# Patient Record
Sex: Female | Born: 1970 | Race: White | Hispanic: No | Marital: Married | Smoking: Never smoker
Health system: Southern US, Community
[De-identification: ages and names within clinical notes are randomized; demographics above are authoritative.]

## PROBLEM LIST (undated history)

## (undated) DIAGNOSIS — F419 Anxiety disorder, unspecified: Secondary | ICD-10-CM

## (undated) DIAGNOSIS — N200 Calculus of kidney: Secondary | ICD-10-CM

## (undated) DIAGNOSIS — F39 Unspecified mood [affective] disorder: Secondary | ICD-10-CM

## (undated) DIAGNOSIS — E785 Hyperlipidemia, unspecified: Secondary | ICD-10-CM

## (undated) DIAGNOSIS — M542 Cervicalgia: Secondary | ICD-10-CM

## (undated) HISTORY — DX: Calculus of kidney: N20.0

## (undated) HISTORY — PX: ABLATION: SHX5711

## (undated) HISTORY — DX: Cervicalgia: M54.2

## (undated) HISTORY — DX: Unspecified mood (affective) disorder: F39

## (undated) HISTORY — DX: Hyperlipidemia, unspecified: E78.5

## (undated) HISTORY — PX: ENDOMETRIAL ABLATION: SHX621

---

## 2000-02-13 ENCOUNTER — Other Ambulatory Visit: Admission: RE | Admit: 2000-02-13 | Discharge: 2000-02-13 | Payer: Self-pay | Admitting: *Deleted

## 2001-03-02 ENCOUNTER — Other Ambulatory Visit: Admission: RE | Admit: 2001-03-02 | Discharge: 2001-03-02 | Payer: Self-pay | Admitting: Obstetrics and Gynecology

## 2001-09-27 ENCOUNTER — Inpatient Hospital Stay (HOSPITAL_COMMUNITY): Admission: AD | Admit: 2001-09-27 | Discharge: 2001-09-30 | Payer: Self-pay | Admitting: Obstetrics and Gynecology

## 2001-11-03 ENCOUNTER — Other Ambulatory Visit: Admission: RE | Admit: 2001-11-03 | Discharge: 2001-11-03 | Payer: Self-pay | Admitting: Obstetrics and Gynecology

## 2002-11-16 ENCOUNTER — Other Ambulatory Visit: Admission: RE | Admit: 2002-11-16 | Discharge: 2002-11-16 | Payer: Self-pay | Admitting: Obstetrics and Gynecology

## 2005-09-16 ENCOUNTER — Ambulatory Visit (HOSPITAL_COMMUNITY): Admission: RE | Admit: 2005-09-16 | Discharge: 2005-09-16 | Payer: Self-pay | Admitting: Obstetrics and Gynecology

## 2005-09-16 ENCOUNTER — Encounter (INDEPENDENT_AMBULATORY_CARE_PROVIDER_SITE_OTHER): Payer: Self-pay | Admitting: *Deleted

## 2007-11-13 ENCOUNTER — Emergency Department (HOSPITAL_BASED_OUTPATIENT_CLINIC_OR_DEPARTMENT_OTHER): Admission: EM | Admit: 2007-11-13 | Discharge: 2007-11-13 | Payer: Self-pay | Admitting: Emergency Medicine

## 2009-06-30 ENCOUNTER — Ambulatory Visit (HOSPITAL_COMMUNITY): Admission: RE | Admit: 2009-06-30 | Discharge: 2009-06-30 | Payer: Self-pay | Admitting: Internal Medicine

## 2010-05-25 NOTE — Discharge Summary (Signed)
   NAME:  Carmen Robinson, Carmen Robinson                        ACCOUNT NO.:  [id number]   MEDICAL RECORD NO.:  [id number]                   PATIENT TYPE:  INP   LOCATION:  9104                                 FACILITY:  [facility]   PHYSICIAN:  Lenoard Aden, M.D.             DATE OF BIRTH:  1970/02/01   DATE OF ADMISSION:  09/27/2001  DATE OF DISCHARGE:  09/30/2001                                 DISCHARGE SUMMARY   HISTORY OF PRESENT ILLNESS:  The patient underwent uncomplicated primary  cesarean section for nonreassuring fetal heart rate and active phase arrest  on September 27, 2001.  Postoperative course uncomplicated.  Tolerated  regular diet without difficulty.  Hemoglobin of 9.5.  Discharged to home day  three.  Prenatal vitamins, iron, Prozac, and Tylox are discharge  medications.  Discharge teaching done.  Follow up in the office four to six  weeks.                                               Lenoard Aden, M.D.    RJT/MEDQ  D:  11/01/2001  T:  11/02/2001  Job:  [id number]

## 2010-05-25 NOTE — Op Note (Signed)
NAME:  Carmen Robinson, Carmen Robinson              ACCOUNT NO.:  [id number]   MEDICAL RECORD NO.:  [id number]          PATIENT TYPE:  AMB   LOCATION:  SDC                           FACILITY:  [facility]   PHYSICIAN:  Lenoard Aden, M.D.DATE OF BIRTH:  [birth year]   DATE OF PROCEDURE:  09/16/2005  DATE OF DISCHARGE:                                 OPERATIVE REPORT   PREOPERATIVE DIAGNOSES:  1. Dysmenorrhea.  2. Menorrhagia.   POSTOPERATIVE DIAGNOSES:  1. Dysmenorrhea.  2. Menorrhagia.   PROCEDURE:  Diagnostic hysteroscopy, D&C and NovaSure endometrial ablation.   SURGEON:  Lenoard Aden, M.D.   ANESTHESIA:  General.   ESTIMATED BLOOD LOSS:  Less than 50 mL.   COMPLICATIONS:  None.   FLUID DEFICIT:  10 mL.   Patient to recovery in good condition.   DESCRIPTION OF PROCEDURE:  After being apprised of the risks of anesthesia,  infection, bleeding, uterine perforation and possible need for repair,  delayed risks, immediate complications to include bowel and bladder injury  with possible need for repair, increased vaginal discharge for approximately  2-3 weeks. The patient was brought to the operating room where she was  administered a general anesthetic without complications, prepped and draped  in the usual sterile fashion, catheterized until the bladder was empty. Exam  under anesthesia reveals a mid position uterus, bulky, no adnexal masses.  The uterus sounds to 10 cm, intracervical length of 5 cm, cervix is easily  dilated up from a 23 Pratt dilator, hysteroscope placed, thickened  endometrial, bilateral normal tubal ostia, no evidence of structural lesions  noted. D&C performed using sharp curettings without difficulty. Please note  prior to the procedure, a dilute Pitressin solution was placed at 3 and 9  o'clock at the cervicovaginal junction, 16 mL of a dilute solution noted. At  this time, the hysteroscope was replaced and __________  D&C revealing no  evidence of any focal  structural lesions. Ablation is performed using the  standard sequence. CO2 test is negative. Cavity width is measured up to 4.2  cm. The procedure is initiated and terminated in 90 seconds. Instruments  removed  atraumatically. Revisualization reveals a normal endometrial cavity, no  evidence of perforation, good ablation of the endometrial cavity is  appreciated. At this time, all instruments are removed from the vagina. The  patient tolerated the procedure well, was awakened and transferred to  recovery in good condition.      Lenoard Aden, M.D.  Electronically Signed     RJT/MEDQ  D:  09/16/2005  T:  09/17/2005  Job:  [id number]

## 2010-05-25 NOTE — Op Note (Signed)
NAME:  Carmen Robinson, Carmen Robinson                        ACCOUNT NO.:  [id number]   MEDICAL RECORD NO.:  [id number]                   PATIENT TYPE:  INP   LOCATION:  9167                                 [facility]ACILITY:  [facility]   PHYSICIAN:  Lenoard Aden, M.D.             DATE OF BIRTH:  Nov 04, 1970   DATE OF PROCEDURE:  09/27/2001  DATE OF DISCHARGE:                                 OPERATIVE REPORT   PREOPERATIVE DIAGNOSES:  1. Intrauterine pregnancy at 38 weeks.  2. Active phase arrest.  3. Nonreassuring fetal heart rate.  4. Presumed large for gestational age.   POSTOPERATIVE DIAGNOSES:  1. Intrauterine pregnancy at 38 weeks.  2. Active phase arrest.  3. Nonreassuring fetal heart rate.  4. Presumed large for gestational age.   PROCEDURE:  Primary low transverse cesarean section.   SURGEON:  Lenoard Aden, M.D.   ASSISTANT:  Pershing Cox, M.D.   ANESTHESIA:  Epidural by Belva Agee, M.D.   ESTIMATED BLOOD LOSS:  1000 cc.   COMPLICATIONS:  None.   DRAINS:  Foley.   COUNTS:  Correct.   FINDINGS:  Full-term living female, right occipitotransverse position, Apgars  9 and 9.  Umbilical artery pH 7.32. Normal tubes and ovaries.  Patient to  recovery in good condition.   DESCRIPTION OF PROCEDURE:  After being apprised of the risks of anesthesia,  infection, bleeding, injury to abdominal organs and need for repair, the  patient was brought to the operating room where she was administered a  dosing of epidural anesthetic without complications, prepped and draped in  the usual sterile fashion and Foley catheter previously placed.  After  achieving adequate anesthesia, dilute Marcaine solution was placed in the  area of the Pfannenstiel skin incision which was then made with the scalpel,  carried down to the fascia which was nicked in the midline and opened  transversely using Mayo scissors.  The rectus muscles were dissected sharply  in the midline, peritoneum dissected  bluntly, and cavity entered without  complications.  Bladder blade placed.  The visceral peritoneum was scored in  a smile-like fashion, dissected sharply off the lower uterine segment.  The  uterus was scored in a smiling fashion.  Atraumatic delivery of full-term  living female from right occiput transverse position, handed to the  pediatricians in attendance, showing Apgars of 9 and 9.  Umbilical artery  blood is collected; cord pH is 7.32.  The placenta is delivered manually  intact after collection of cord blood.  Uterus was delivered into the  abdominal cavity and noted to be normal.  Normal cavity, normal tubes,  normal tubes.  Uterus is curetted using a dry lap pack and closed using a 0  Monocryl in a continuous running fashion.  A second imbricating layer is  placed with multiple interrupted sutures placed to control small bleeding  points along the midline and left lateral aspect of the  incision.  The  bladder flap was inspected and found to be hemostatic.  The pericolic  gutters were irrigated.  All blood clots were subsequently removed.  After  achieving adequate hemostasis, the fascia was closed using a 1 Vicryl in a  continuous running fashion.  The skin was closed with staples.  The patient  tolerated the procedure well and was transferred to recovery in good  condition.                                               Lenoard Aden, M.D.    RJT/MEDQ  D:  09/27/2001  T:  09/28/2001  Job:  [id number]

## 2010-05-25 NOTE — H&P (Signed)
   NAME:  Carmen Robinson, Carmen Robinson                        ACCOUNT NO.:  [id number]   MEDICAL RECORD NO.:  [id number]                   PATIENT TYPE:  INP   LOCATION:  9167                                 [facility]ACILITY:  [facility]   PHYSICIAN:  Lenoard Aden, M.D.             DATE OF BIRTH:  [birth year]   DATE OF ADMISSION:  09/27/2001  DATE OF DISCHARGE:                                HISTORY & PHYSICAL   CHIEF COMPLAINT:  Large for gestational age for induction.   HISTORY OF PRESENT ILLNESS:  Patient is a 40 year old white female G2, P1  with EDD of October 09, 2001 at 38+ weeks' with presumed macrosomia and  borderline polyhydramnios for induction.   ALLERGIES:  Allergic to SHELLFISH and PENICILLIN.   HISTORY:  1. Medications:  Prenatal vitamins.  2. History of spontaneous vaginal delivery in 1999.  3. 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  4. Previous history of depression on Prozac.  No other medical, surgical or hospitalizations.   FAMILY HISTORY:  Heart disease, smoking and hypertension.   PRENATAL LABORATORY DATA:  Blood type of O+, negative antibody screen, RPR  nonreactive, rubella immune, hepatitis B surface antigen negative, HIV  negative, and Group B Strep is positive.   PHYSICAL EXAMINATION:  GENERAL:  She is a well-developed, well-nourished  somewhat anxious white female.  No apparent distress.  HEENT:  Normal.  LUNGS:  Clear.  HEART:  Regular rate and rhythm.  ABDOMEN:  Abdomen is soft, gravid, nontender.  Estimated fetal weight is 8.0  to 8.5 pounds.  Cervix is 2.0 cm, 50% vertex, -2.  EXTREMITIES:  No cords.  NEUROLOGICAL:  Nonfocal.   IMPRESSION:  1. 38 weeks' intrauterine pregnancy.  2. Presumed large for gestational age with borderline polyhydramnios.    PLAN:  Proceed with induction.  Risks, benefits of induction versus  expectant management discussed.  Patient acknowledges and wishes to proceed  with induction.                    Lenoard Aden, M.D.    RJT/MEDQ  D:  09/27/2001  T:  09/27/2001  Job:  [id number]   cc:   [facility]

## 2010-05-27 ENCOUNTER — Inpatient Hospital Stay (HOSPITAL_COMMUNITY)
Admission: AD | Admit: 2010-05-27 | Discharge: 2010-05-27 | Disposition: A | Discharge status: Home / Self Care | Payer: BC Managed Care – PPO | Source: Ambulatory Visit | Attending: Obstetrics and Gynecology | Admitting: Obstetrics and Gynecology

## 2010-05-27 DIAGNOSIS — R1032 Left lower quadrant pain: Secondary | ICD-10-CM | POA: Insufficient documentation

## 2010-05-27 LAB — URINALYSIS, ROUTINE W REFLEX MICROSCOPIC
Bilirubin Urine: NEGATIVE
Leukocytes, UA: NEGATIVE
Nitrite: NEGATIVE
Protein, ur: NEGATIVE mg/dL

## 2010-05-27 LAB — URINE MICROSCOPIC-ADD ON

## 2010-05-27 LAB — POCT PREGNANCY, URINE: Preg Test, Ur: NEGATIVE

## 2010-05-29 ENCOUNTER — Other Ambulatory Visit: Payer: Self-pay | Admitting: Obstetrics and Gynecology

## 2010-05-29 DIAGNOSIS — R1031 Right lower quadrant pain: Secondary | ICD-10-CM

## 2010-05-30 ENCOUNTER — Ambulatory Visit
Admission: RE | Admit: 2010-05-30 | Discharge: 2010-05-30 | Disposition: A | Discharge status: Home / Self Care | Payer: BC Managed Care – PPO | Source: Ambulatory Visit | Attending: Obstetrics and Gynecology | Admitting: Obstetrics and Gynecology

## 2010-05-30 DIAGNOSIS — R1031 Right lower quadrant pain: Secondary | ICD-10-CM

## 2010-05-30 MED ORDER — IOHEXOL 300 MG/ML  SOLN
100.0000 mL | Freq: Once | INTRAMUSCULAR | Status: AC | PRN
  Administered 2010-05-30: 100 mL via INTRAVENOUS

## 2010-05-31 ENCOUNTER — Telehealth: Payer: Self-pay | Admitting: Internal Medicine

## 2010-05-31 NOTE — Telephone Encounter (Signed)
Sue Lush called and left me a message to call her. Called and left her a message that I scheduled patient to see Willette Cluster, NP on 06/01/10 at 10:30 AM. Requested she send [place] recent office notes.

## 2010-05-31 NOTE — Telephone Encounter (Signed)
Left a message for Carmen Robinson to call me.

## 2010-06-01 ENCOUNTER — Ambulatory Visit: Payer: BC Managed Care – PPO | Admitting: Nurse Practitioner

## 2010-06-01 NOTE — Telephone Encounter (Signed)
Patient called and cancelled appointment for today. States she is going to a urology appointment today. Called and left Sue Lush with [facility] a message that patient cancelled.

## 2010-06-08 NOTE — H&P (Signed)
  NAME:  Carmen Robinson, DUGGIN              ACCOUNT NO.:  [id number]  MEDICAL RECORD NO.:  [id number]           PATIENT TYPE:  O  LOCATION:  [facility]                         [facility]AC[facility]  PHYSICIAN:  Lenoard Aden, M.D.DATE OF BIRTH:  February 07, 1970  DATE OF ADMISSION:  05/27/2010 DATE OF DISCHARGE:  05/27/2010                             HISTORY & PHYSICAL   CHIEF COMPLAINT:  History of intermittent left lower quadrant pain.  She is a 40 year old white female G2, P2, status post NovaSure in 2007, who presents now with a less than 1 week history of vague left lower quadrant pain for which she had a negative workup, was given Vicodin with now marked improvement in her pain.  She has allergies to Tylenol and medications are multivitamin and Effexor.  She has medical history remarkable for postpartum depression, anemia, menorrhagia, and dysmenorrhea, now resolved.  She has a family history of panic disorder and diabetes.  SURGICAL HISTORY:  Remarkable for C-section and NovaSure as noted.  SOCIAL HISTORY:  She is a nonsmoker and nondrinker.  She denies domestic violence.  PHYSICAL EXAMINATION:  GENERAL:  She is a well-developed, well- nourished, white female in no acute distress. HEENT:  Normal. LUNGS:  Clear. HEART:  Regular rate and rhythm. ABDOMEN:  Soft, nontender. PELVIC:  Normal size uterus and no adnexal masses. EXTREMITIES:  No cords. NEUROLOGIC:  Nonfocal. SKIN:  Intact.  IMPRESSION:  Left lower quadrant pain with negative workup.  Now, pain resolved.  PLAN:  Discharge home.  Follow up in the office for ultrasound pending pain resolution.    Lenoard Aden, M.D.    RJT/MEDQ  D:  05/27/2010  T:  05/28/2010  Job:  [id number]  Electronically Signed by Olivia Mackie M.D. on 06/08/2010 02:00:54 PM

## 2011-10-06 ENCOUNTER — Emergency Department
Admission: EM | Admit: 2011-10-06 | Discharge: 2011-10-06 | Disposition: A | Discharge status: Home / Self Care | Payer: BC Managed Care – PPO | Source: Home / Self Care | Attending: Family Medicine | Admitting: Family Medicine

## 2011-10-06 DIAGNOSIS — IMO0001 Reserved for inherently not codable concepts without codable children: Secondary | ICD-10-CM

## 2011-10-06 DIAGNOSIS — F419 Anxiety disorder, unspecified: Secondary | ICD-10-CM | POA: Insufficient documentation

## 2011-10-06 DIAGNOSIS — L02519 Cutaneous abscess of unspecified hand: Secondary | ICD-10-CM

## 2011-10-06 HISTORY — DX: Anxiety disorder, unspecified: F41.9

## 2011-10-06 MED ORDER — DOXYCYCLINE HYCLATE 100 MG PO TABS: 100.0000 mg | ORAL_TABLET | Freq: Two times a day (BID) | ORAL | Status: DC

## 2011-10-06 MED ORDER — FLUCONAZOLE 150 MG PO TABS: 150.0000 mg | ORAL_TABLET | Freq: Once | ORAL | Status: DC

## 2011-10-06 NOTE — ED Provider Notes (Signed)
History     CSN: [id number]  Arrival date & time 10/06/11  1556   None     Chief Complaint  Patient presents with  . Wound Infection    left hand 3 rd digit     HPI Comments: Patient states that she had an infected left third finger about a month ago that resolved after drainage.  She has now developed recurrent mild pain in the same location. She also complains of intermittent vaginal itching and discharge without abdominal or pelvic pain.  She declines pelvic exam.  Patient is a 41 y.o. female presenting with hand pain. The history is provided by the patient.  Hand Pain This is a recurrent problem. The current episode started yesterday. The problem occurs constantly. The problem has been gradually worsening. Associated symptoms comments: none. Nothing aggravates the symptoms. Nothing relieves the symptoms. She has tried nothing for the symptoms.    Past medical history:  anxiety  Past surgical history:  Uterine ablation  Family history:  Diabetes, heart disease  History  Substance Use Topics  . Smoking status: No  . Smokeless tobacco: Not on file  . Alcohol Use: No     OB History    No data available      Review of Systems  All other systems reviewed and are negative.    Allergies  Penicillins and Shellfish allergy  Home Medications   Current Outpatient Rx  Name Route Sig Dispense Refill  . VENLAFAXINE HCL 75 MG PO TABS Oral Take 75 mg by mouth 2 (two) times daily.    Marland Kitchen DOXYCYCLINE HYCLATE 100 MG PO TABS Oral Take 1 tablet (100 mg total) by mouth 2 (two) times daily. 20 tablet 0  . FLUCONAZOLE 150 MG PO TABS Oral Take 1 tablet (150 mg total) by mouth once. Repeat in two days 2 tablet 0    BP 119/79  Pulse 74  Temp 98.1 F (36.7 C) (Oral)  Resp 16  Ht 5\' 2"  (1.575 m)  Wt 182 lb (82.555 kg)  BMI 33.29 kg/m2  SpO2 97%  Physical Exam Nursing notes and Vital Signs reviewed. Appearance:  Patient appears stated age, and in no acute distress.  Patient is  obese (BMI 33.3) Eyes:  Pupils are equal, round, and reactive to light and accomodation.  Extraocular movement is intact.  Conjunctivae are not inflamed   Abdomen:  Nontender Extremities:  Left third fingertip:  Tender and slightly erythematous but not swollen or fluctuant.  Distal Neurovascular function is intact.  Skin:  No rash present.   ED Course  Procedures none      1. Cellulitis of third finger, left       MDM  Begin doxycycline. Soak fingertip in warm water two or three times daily Patient declines pelvic exam; begin Diflucan; follow-up with GYN if vaginal discharge not resolved one week.        Lattie Haw, MD 10/09/11 [phone]

## 2011-10-06 NOTE — ED Notes (Signed)
Carmen Robinson complains of throbbing pain in her 3 rd finger left hand x 1 day. About a month ago she had redness and swelling at the tip of her finger and a nurse drained the finger. The swelling has returned.   She also complains of discharge, itching and burning in her vagina off and on for a month. She states she has been in the pool a lot more than normal.

## 2011-10-10 ENCOUNTER — Telehealth: Payer: Self-pay | Admitting: *Deleted

## 2012-02-17 ENCOUNTER — Other Ambulatory Visit (HOSPITAL_COMMUNITY): Payer: Self-pay | Admitting: Obstetrics and Gynecology

## 2012-02-17 DIAGNOSIS — Z1231 Encounter for screening mammogram for malignant neoplasm of breast: Secondary | ICD-10-CM

## 2012-02-28 ENCOUNTER — Ambulatory Visit (HOSPITAL_COMMUNITY): Payer: BC Managed Care – PPO

## 2012-03-13 ENCOUNTER — Ambulatory Visit (HOSPITAL_COMMUNITY): Payer: BC Managed Care – PPO

## 2012-05-11 ENCOUNTER — Ambulatory Visit: Payer: BC Managed Care – PPO | Admitting: Family Medicine

## 2012-05-11 DIAGNOSIS — Z0289 Encounter for other administrative examinations: Secondary | ICD-10-CM

## 2014-02-18 ENCOUNTER — Ambulatory Visit (INDEPENDENT_AMBULATORY_CARE_PROVIDER_SITE_OTHER): Payer: BLUE CROSS/BLUE SHIELD | Admitting: Nurse Practitioner

## 2014-02-18 ENCOUNTER — Encounter: Payer: Self-pay | Admitting: Nurse Practitioner

## 2014-02-18 ENCOUNTER — Other Ambulatory Visit: Payer: BLUE CROSS/BLUE SHIELD

## 2014-02-18 VITALS — BP 120/74 | HR 68 | Temp 98.2°F | Ht 63.5 in | Wt 201.0 lb

## 2014-02-18 DIAGNOSIS — Z0000 Encounter for general adult medical examination without abnormal findings: Secondary | ICD-10-CM

## 2014-02-18 NOTE — Patient Instructions (Signed)
My office will call with lab results.  Great job with increasing exercise!  Consider reading Eat to Live by Excell Seltzer for great information on human nutrition.  For best sleep: no screen time (TV, computer, handheld) 1 hour before you want to fall asleep. No caffeine or stimulants after 6 pm. Alcoholic beverages can help you fall asleep, but they will cause awakenings. Do not exercise within 2 hours of going to bed.   Very nice to meet you!  Our office will call you with lab results and any necessary follow up. Pleasure to meet you!  Preventive Care for Adults, Female A healthy lifestyle and preventive care can promote health and wellness. Preventive health guidelines for women include the following key practices.  A routine yearly physical is a good way to check with your caregiver about your health and preventive screening. It is a chance to share any concerns and updates on your health, and to receive a thorough exam.  Visit your dentist for a routine exam and preventive care every 6 months. Brush your teeth twice a day and floss once a day. Good oral hygiene prevents tooth decay and gum disease.  The frequency of eye exams is based on your age, health, family medical history, use of contact lenses, and other factors. Follow your caregiver's recommendations for frequency of eye exams.  Eat a healthy diet. Foods like vegetables, fruits, whole grains, low-fat dairy products, and lean protein foods contain the nutrients you need without too many calories. Decrease your intake of foods high in solid fats, added sugars, and salt. Eat the right amount of calories for you.Get information about a proper diet from your caregiver, if necessary.  Regular physical exercise is one of the most important things you can do for your health. Most adults should get at least 150 minutes of moderate-intensity exercise (any activity that increases your heart rate and causes you to sweat) each week. In  addition, most adults need muscle-strengthening exercises on 2 or more days a week.  Maintain a healthy weight. The body mass index (BMI) is a screening tool to identify possible weight problems. It provides an estimate of body fat based on height and weight. Your caregiver can help determine your BMI, and can help you achieve or maintain a healthy weight.For adults 20 years and older:  A BMI below 18.5 is considered underweight.  A BMI of 18.5 to 24.9 is normal.  A BMI of 25 to 29.9 is considered overweight.  A BMI of 30 and above is considered obese.  Maintain normal blood lipids and cholesterol levels by exercising and minimizing your intake of saturated fat. Eat a balanced diet with plenty of fruit and vegetables. Blood tests for lipids and cholesterol should begin at age 28 and be repeated every 5 years. If your lipid or cholesterol levels are high, you are over 50, or you are at high risk for heart disease, you may need your cholesterol levels checked more frequently.Ongoing high lipid and cholesterol levels should be treated with medicines if diet and exercise are not effective.  If you smoke, find out from your caregiver how to quit. If you do not use tobacco, do not start.  Lung cancer screening is recommended for adults aged 71 80 years who are at high risk for developing lung cancer because of a history of smoking. Yearly low-dose computed tomography (CT) is recommended for people who have at least a 30-pack-year history of smoking and are a current smoker or have  quit within the past 15 years. A pack year of smoking is smoking an average of 1 pack of cigarettes a day for 1 year (for example: 1 pack a day for 30 years or 2 packs a day for 15 years). Yearly screening should continue until the smoker has stopped smoking for at least 15 years. Yearly screening should also be stopped for people who develop a health problem that would prevent them from having lung cancer treatment.  If you  are pregnant, do not drink alcohol. If you are breastfeeding, be very cautious about drinking alcohol. If you are not pregnant and choose to drink alcohol, do not exceed 1 drink per day. One drink is considered to be 12 ounces (355 mL) of beer, 5 ounces (148 mL) of wine, or 1.5 ounces (44 mL) of liquor.  Avoid use of street drugs. Do not share needles with anyone. Ask for help if you need support or instructions about stopping the use of drugs.  High blood pressure causes heart disease and increases the risk of stroke. Your blood pressure should be checked at least every 1 to 2 years. Ongoing high blood pressure should be treated with medicines if weight loss and exercise are not effective.  If you are 19 to 45 years old, ask your caregiver if you should take aspirin to prevent strokes.  Diabetes screening involves taking a blood sample to check your fasting blood sugar level. This should be done once every 3 years, after age 80, if you are within normal weight and without risk factors for diabetes. Testing should be considered at a younger age or be carried out more frequently if you are overweight and have at least 1 risk factor for diabetes.  Breast cancer screening is essential preventive care for women. You should practice "breast self-awareness." This means understanding the normal appearance and feel of your breasts and may include breast self-examination. Any changes detected, no matter how small, should be reported to a caregiver. Women in their 47s and 30s should have a clinical breast exam (CBE) by a caregiver as part of a regular health exam every 1 to 3 years. After age 29, women should have a CBE every year. Starting at age 61, women should consider having a mammography (breast X-ray test) every year. Women who have a family history of breast cancer should talk to their caregiver about genetic screening. Women at a high risk of breast cancer should talk to their caregivers about having  magnetic resonance imaging (MRI) and a mammography every year.  Breast cancer gene (BRCA)-related cancer risk assessment is recommended for women who have family members with BRCA-related cancers. BRCA-related cancers include breast, ovarian, tubal, and peritoneal cancers. Having family members with these cancers may be associated with an increased risk for harmful changes (mutations) in the breast cancer genes BRCA1 and BRCA2. Results of the assessment will determine the need for genetic counseling and BRCA1 and BRCA2 testing.  The Pap test is a screening test for cervical cancer. A Pap test can show cell changes on the cervix that might become cervical cancer if left untreated. A Pap test is a procedure in which cells are obtained and examined from the lower end of the uterus (cervix).  Women should have a Pap test starting at age 59.  Between ages 17 and 56, Pap tests should be repeated every 2 years.  Beginning at age 19, you should have a Pap test every 3 years as long as the past 3 Pap  tests have been normal.  Some women have medical problems that increase the chance of getting cervical cancer. Talk to your caregiver about these problems. It is especially important to talk to your caregiver if a new problem develops soon after your last Pap test. In these cases, your caregiver may recommend more frequent screening and Pap tests.  The above recommendations are the same for women who have or have not gotten the vaccine for human papillomavirus (HPV).  If you had a hysterectomy for a problem that was not cancer or a condition that could lead to cancer, then you no longer need Pap tests. Even if you no longer need a Pap test, a regular exam is a good idea to make sure no other problems are starting.  If you are between ages 61 and 39, and you have had normal Pap tests going back 10 years, you no longer need Pap tests. Even if you no longer need a Pap test, a regular exam is a good idea to make  sure no other problems are starting.  If you have had past treatment for cervical cancer or a condition that could lead to cancer, you need Pap tests and screening for cancer for at least 20 years after your treatment.  If Pap tests have been discontinued, risk factors (such as a new sexual partner) need to be reassessed to determine if screening should be resumed.  The HPV test is an additional test that may be used for cervical cancer screening. The HPV test looks for the virus that can cause the cell changes on the cervix. The cells collected during the Pap test can be tested for HPV. The HPV test could be used to screen women aged 25 years and older, and should be used in women of any age who have unclear Pap test results. After the age of 14, women should have HPV testing at the same frequency as a Pap test.  Colorectal cancer can be detected and often prevented. Most routine colorectal cancer screening begins at the age of 52 and continues through age 102. However, your caregiver may recommend screening at an earlier age if you have risk factors for colon cancer. On a yearly basis, your caregiver may provide home test kits to check for hidden blood in the stool. Use of a small camera at the end of a tube, to directly examine the colon (sigmoidoscopy or colonoscopy), can detect the earliest forms of colorectal cancer. Talk to your caregiver about this at age 65, when routine screening begins. Direct examination of the colon should be repeated every 5 to 10 years through age 74, unless early forms of pre-cancerous polyps or small growths are found.  Hepatitis C blood testing is recommended for all people born from [birth year] through 1965 and any individual with known risks for hepatitis C.  Practice safe sex. Use condoms and avoid high-risk sexual practices to reduce the spread of sexually transmitted infections (STIs). STIs include gonorrhea, chlamydia, syphilis, trichomonas, herpes, HPV, and human  immunodeficiency virus (HIV). Herpes, HIV, and HPV are viral illnesses that have no cure. They can result in disability, cancer, and death. Sexually active women aged 47 and younger should be checked for chlamydia. Older women with new or multiple partners should also be tested for chlamydia. Testing for other STIs is recommended if you are sexually active and at increased risk.  Osteoporosis is a disease in which the bones lose minerals and strength with aging. This can result in serious bone  fractures. The risk of osteoporosis can be identified using a bone density scan. Women ages 32 and over and women at risk for fractures or osteoporosis should discuss screening with their caregivers. Ask your caregiver whether you should take a calcium supplement or vitamin D to reduce the rate of osteoporosis.  Menopause can be associated with physical symptoms and risks. Hormone replacement therapy is available to decrease symptoms and risks. You should talk to your caregiver about whether hormone replacement therapy is right for you.  Use sunscreen. Apply sunscreen liberally and repeatedly throughout the day. You should seek shade when your shadow is shorter than you. Protect yourself by wearing long sleeves, pants, a wide-brimmed hat, and sunglasses year round, whenever you are outdoors.  Once a month, do a whole body skin exam, using a mirror to look at the skin on your back. Notify your caregiver of new moles, moles that have irregular borders, moles that are larger than a pencil eraser, or moles that have changed in shape or color.  Stay current with required immunizations.  Influenza vaccine. All adults should be immunized every year.  Tetanus, diphtheria, and acellular pertussis (Td, Tdap) vaccine. Pregnant women should receive 1 dose of Tdap vaccine during each pregnancy. The dose should be obtained regardless of the length of time since the last dose. Immunization is preferred during the 27th to 36th  week of gestation. An adult who has not previously received Tdap or who does not know her vaccine status should receive 1 dose of Tdap. This initial dose should be followed by tetanus and diphtheria toxoids (Td) booster doses every 10 years. Adults with an unknown or incomplete history of completing a 3-dose immunization series with Td-containing vaccines should begin or complete a primary immunization series including a Tdap dose. Adults should receive a Td booster every 10 years.  Varicella vaccine. An adult without evidence of immunity to varicella should receive 2 doses or a second dose if she has previously received 1 dose. Pregnant females who do not have evidence of immunity should receive the first dose after pregnancy. This first dose should be obtained before leaving the health care facility. The second dose should be obtained 4 8 weeks after the first dose.  Human papillomavirus (HPV) vaccine. Females aged 76 26 years who have not received the vaccine previously should obtain the 3-dose series. The vaccine is not recommended for use in pregnant females. However, pregnancy testing is not needed before receiving a dose. If a female is found to be pregnant after receiving a dose, no treatment is needed. In that case, the remaining doses should be delayed until after the pregnancy. Immunization is recommended for any person with an immunocompromised condition through the age of 40 years if she did not get any or all doses earlier. During the 3-dose series, the second dose should be obtained 4 8 weeks after the first dose. The third dose should be obtained 24 weeks after the first dose and 16 weeks after the second dose.  Zoster vaccine. One dose is recommended for adults aged 75 years or older unless certain conditions are present.  Measles, mumps, and rubella (MMR) vaccine. Adults born before 1 generally are considered immune to measles and mumps. Adults born in [birth year] or later should have 1 or more  doses of MMR vaccine unless there is a contraindication to the vaccine or there is laboratory evidence of immunity to each of the three diseases. A routine second dose of MMR vaccine should be obtained  at least 28 days after the first dose for students attending postsecondary schools, health care workers, or international travelers. People who received inactivated measles vaccine or an unknown type of measles vaccine during 1963 1967 should receive 2 doses of MMR vaccine. People who received inactivated mumps vaccine or an unknown type of mumps vaccine before 1979 and are at high risk for mumps infection should consider immunization with 2 doses of MMR vaccine. For females of childbearing age, rubella immunity should be determined. If there is no evidence of immunity, females who are not pregnant should be vaccinated. If there is no evidence of immunity, females who are pregnant should delay immunization until after pregnancy. Unvaccinated health care workers born before [birth year] who lack laboratory evidence of measles, mumps, or rubella immunity or laboratory confirmation of disease should consider measles and mumps immunization with 2 doses of MMR vaccine or rubella immunization with 1 dose of MMR vaccine.  Pneumococcal 13-valent conjugate (PCV13) vaccine. When indicated, a person who is uncertain of her immunization history and has no record of immunization should receive the PCV13 vaccine. An adult aged 86 years or older who has certain medical conditions and has not been previously immunized should receive 1 dose of PCV13 vaccine. This PCV13 should be followed with a dose of pneumococcal polysaccharide (PPSV23) vaccine. The PPSV23 vaccine dose should be obtained at least 8 weeks after the dose of PCV13 vaccine. An adult aged 99 years or older who has certain medical conditions and previously received 1 or more doses of PPSV23 vaccine should receive 1 dose of PCV13. The PCV13 vaccine dose should be obtained 1 or  more years after the last PPSV23 vaccine dose.  Pneumococcal polysaccharide (PPSV23) vaccine. When PCV13 is also indicated, PCV13 should be obtained first. All adults aged 55 years and older should be immunized. An adult younger than age 75 years who has certain medical conditions should be immunized. Any person who resides in a nursing home or long-term care facility should be immunized. An adult smoker should be immunized. People with an immunocompromised condition and certain other conditions should receive both PCV13 and PPSV23 vaccines. People with human immunodeficiency virus (HIV) infection should be immunized as soon as possible after diagnosis. Immunization during chemotherapy or radiation therapy should be avoided. Routine use of PPSV23 vaccine is not recommended for American Indians, [place]atives, or people younger than 65 years unless there are medical conditions that require PPSV23 vaccine. When indicated, people who have unknown immunization and have no record of immunization should receive PPSV23 vaccine. One-time revaccination 5 years after the first dose of PPSV23 is recommended for people aged 51 64 years who have chronic kidney failure, nephrotic syndrome, asplenia, or immunocompromised conditions. People who received 1 2 doses of PPSV23 before age 67 years should receive another dose of PPSV23 vaccine at age 32 years or later if at least 5 years have passed since the previous dose. Doses of PPSV23 are not needed for people immunized with PPSV23 at or after age 75 years.  Meningococcal vaccine. Adults with asplenia or persistent complement component deficiencies should receive 2 doses of quadrivalent meningococcal conjugate (MenACWY-D) vaccine. The doses should be obtained at least 2 months apart. Microbiologists working with certain meningococcal bacteria, [employer] recruits, people at risk during an outbreak, and people who travel to or live in countries with a high rate of meningitis  should be immunized. A first-year college student up through age 72 years who is living in a residence hall should receive a  dose if she did not receive a dose on or after her 16th birthday. Adults who have certain high-risk conditions should receive one or more doses of vaccine.  Hepatitis A vaccine. Adults who wish to be protected from this disease, have certain high-risk conditions, work with hepatitis A-infected animals, work in hepatitis A research labs, or travel to or work in countries with a high rate of hepatitis A should be immunized. Adults who were previously unvaccinated and who anticipate close contact with an international adoptee during the first 60 days after arrival in the [place] from a country with a high rate of hepatitis A should be immunized.  Hepatitis B vaccine. Adults who wish to be protected from this disease, have certain high-risk conditions, may be exposed to blood or other infectious body fluids, are household contacts or sex partners of hepatitis B positive people, are clients or workers in certain care facilities, or travel to or work in countries with a high rate of hepatitis B should be immunized.  Haemophilus influenzae type b (Hib) vaccine. A previously unvaccinated person with asplenia or sickle cell disease or having a scheduled splenectomy should receive 1 dose of Hib vaccine. Regardless of previous immunization, a recipient of a hematopoietic stem cell transplant should receive a 3-dose series 6 12 months after her successful transplant. Hib vaccine is not recommended for adults with HIV infection. Preventive Services / Frequency Ages 48 to 69  Blood pressure check.** / Every 1 to 2 years.  Lipid and cholesterol check.** / Every 5 years beginning at age 36.  Clinical breast exam.** / Every 3 years for women in their 94s and 58s.  BRCA-related cancer risk assessment.** / For women who have family members with a BRCA-related cancer (breast, ovarian, tubal,  or peritoneal cancers).  Pap test.** / Every 2 years from ages 17 through 30. Every 3 years starting at age 40 through age 68 or 35 with a history of 3 consecutive normal Pap tests.  HPV screening.** / Every 3 years from ages 93 through ages 68 to 38 with a history of 3 consecutive normal Pap tests.  Hepatitis C blood test.** / For any individual with known risks for hepatitis C.  Skin self-exam. / Monthly.  Influenza vaccine. / Every year.  Tetanus, diphtheria, and acellular pertussis (Tdap, Td) vaccine.** / Consult your caregiver. Pregnant women should receive 1 dose of Tdap vaccine during each pregnancy. 1 dose of Td every 10 years.  Varicella vaccine.** / Consult your caregiver. Pregnant females who do not have evidence of immunity should receive the first dose after pregnancy.  HPV vaccine. / 3 doses over 6 months, if 74 and younger. The vaccine is not recommended for use in pregnant females. However, pregnancy testing is not needed before receiving a dose.  Measles, mumps, rubella (MMR) vaccine.** / You need at least 1 dose of MMR if you were born in 1957 or later. You may also need a 2nd dose. For females of childbearing age, rubella immunity should be determined. If there is no evidence of immunity, females who are not pregnant should be vaccinated. If there is no evidence of immunity, females who are pregnant should delay immunization until after pregnancy.  Pneumococcal 13-valent conjugate (PCV13) vaccine.** / Consult your caregiver.  Pneumococcal polysaccharide (PPSV23) vaccine.** / 1 to 2 doses if you smoke cigarettes or if you have certain conditions.  Meningococcal vaccine.** / 1 dose if you are age 51 to 61 years and a Market researcher living in  a residence hall, or have one of several medical conditions, you need to get vaccinated against meningococcal disease. You may also need additional booster doses.  Hepatitis A vaccine.** / Consult your  caregiver.  Hepatitis B vaccine.** / Consult your caregiver.  Haemophilus influenzae type b (Hib) vaccine.** / Consult your caregiver. Ages 70 to 25  Blood pressure check.** / Every 1 to 2 years.  Lipid and cholesterol check.** / Every 5 years beginning at age 4.  Lung cancer screening. / Every year if you are aged 67 80 years and have a 30-pack-year history of smoking and currently smoke or have quit within the past 15 years. Yearly screening is stopped once you have quit smoking for at least 15 years or develop a health problem that would prevent you from having lung cancer treatment.  Clinical breast exam.** / Every year after age 57.  BRCA-related cancer risk assessment.** / For women who have family members with a BRCA-related cancer (breast, ovarian, tubal, or peritoneal cancers).  Mammogram.** / Every year beginning at age 54 and continuing for as long as you are in good health. Consult with your caregiver.  Pap test.** / Every 3 years starting at age 53 through age 63 or 77 with a history of 3 consecutive normal Pap tests.  HPV screening.** / Every 3 years from ages 101 through ages 50 to 87 with a history of 3 consecutive normal Pap tests.  Fecal occult blood test (FOBT) of stool. / Every year beginning at age 66 and continuing until age 64. You may not need to do this test if you get a colonoscopy every 10 years.  Flexible sigmoidoscopy or colonoscopy.** / Every 5 years for a flexible sigmoidoscopy or every 10 years for a colonoscopy beginning at age 59 and continuing until age 25.  Hepatitis C blood test.** / For all people born from [birth year] through 1966 and any individual with known risks for hepatitis C.  Skin self-exam. / Monthly.  Influenza vaccine. / Every year.  Tetanus, diphtheria, and acellular pertussis (Tdap/Td) vaccine.** / Consult your caregiver. Pregnant women should receive 1 dose of Tdap vaccine during each pregnancy. 1 dose of Td every 10 years.  Varicella  vaccine.** / Consult your caregiver. Pregnant females who do not have evidence of immunity should receive the first dose after pregnancy.  Zoster vaccine.** / 1 dose for adults aged 7 years or older.  Measles, mumps, rubella (MMR) vaccine.** / You need at least 1 dose of MMR if you were born in 1957 or later. You may also need a 2nd dose. For females of childbearing age, rubella immunity should be determined. If there is no evidence of immunity, females who are not pregnant should be vaccinated. If there is no evidence of immunity, females who are pregnant should delay immunization until after pregnancy.  Pneumococcal 13-valent conjugate (PCV13) vaccine.** / Consult your caregiver.  Pneumococcal polysaccharide (PPSV23) vaccine.** / 1 to 2 doses if you smoke cigarettes or if you have certain conditions.  Meningococcal vaccine.** / Consult your caregiver.  Hepatitis A vaccine.** / Consult your caregiver.  Hepatitis B vaccine.** / Consult your caregiver.  Haemophilus influenzae type b (Hib) vaccine.** / Consult your caregiver. Ages 28 and over  Blood pressure check.** / Every 1 to 2 years.  Lipid and cholesterol check.** / Every 5 years beginning at age 50.  Lung cancer screening. / Every year if you are aged 69 80 years and have a 30-pack-year history of smoking and currently smoke or  have quit within the past 15 years. Yearly screening is stopped once you have quit smoking for at least 15 years or develop a health problem that would prevent you from having lung cancer treatment.  Clinical breast exam.** / Every year after age 45.  BRCA-related cancer risk assessment.** / For women who have family members with a BRCA-related cancer (breast, ovarian, tubal, or peritoneal cancers).  Mammogram.** / Every year beginning at age 33 and continuing for as long as you are in good health. Consult with your caregiver.  Pap test.** / Every 3 years starting at age 33 through age 67 or 79 with a 3  consecutive normal Pap tests. Testing can be stopped between 65 and 70 with 3 consecutive normal Pap tests and no abnormal Pap or HPV tests in the past 10 years.  HPV screening.** / Every 3 years from ages 46 through ages 81 or 72 with a history of 3 consecutive normal Pap tests. Testing can be stopped between 65 and 70 with 3 consecutive normal Pap tests and no abnormal Pap or HPV tests in the past 10 years.  Fecal occult blood test (FOBT) of stool. / Every year beginning at age 49 and continuing until age 11. You may not need to do this test if you get a colonoscopy every 10 years.  Flexible sigmoidoscopy or colonoscopy.** / Every 5 years for a flexible sigmoidoscopy or every 10 years for a colonoscopy beginning at age 8 and continuing until age 59.  Hepatitis C blood test.** / For all people born from [birth year] through 1966 and any individual with known risks for hepatitis C.  Osteoporosis screening.** / A one-time screening for women ages 66 and over and women at risk for fractures or osteoporosis.  Skin self-exam. / Monthly.  Influenza vaccine. / Every year.  Tetanus, diphtheria, and acellular pertussis (Tdap/Td) vaccine.** / 1 dose of Td every 10 years.  Varicella vaccine.** / Consult your caregiver.  Zoster vaccine.** / 1 dose for adults aged 52 years or older.  Pneumococcal 13-valent conjugate (PCV13) vaccine.** / Consult your caregiver.  Pneumococcal polysaccharide (PPSV23) vaccine.** / 1 dose for all adults aged 21 years and older.  Meningococcal vaccine.** / Consult your caregiver.  Hepatitis A vaccine.** / Consult your caregiver.  Hepatitis B vaccine.** / Consult your caregiver.  Haemophilus influenzae type b (Hib) vaccine.** / Consult your caregiver. ** Family history and personal history of risk and conditions may change your caregiver's recommendations. Document Released: 02/19/2001 Document Revised: 04/20/2012 Document Reviewed: 05/21/2010 [facility] Patient Information  2014 [place], [place].

## 2014-02-18 NOTE — Progress Notes (Signed)
Pre visit review using our clinic review tool, if applicable. No additional management support is needed unless otherwise documented below in the visit note. 

## 2014-02-21 ENCOUNTER — Encounter: Payer: Self-pay | Admitting: Nurse Practitioner

## 2014-02-21 NOTE — Progress Notes (Signed)
Subjective:     Carmen Robinson is a 44 y.o. female and is here for a comprehensive physical exam. The patient reports problems - insomnia for which she sees provider at [place]. She is also treated there for mood disorder & ADD for which she takes adderall & lamictal. She has tried [place] for sleep-not effective at 2 mg, 4 mg is effective. I advised that is too much med. She plans to f/u w/ provider. Prev care: she sees DR Ronita Hipps for [facility]. She had nml MMG 2014 & 2015. 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 Last known stones were last year. She tries to avoid oxylates & soda. She has lost several pounds in last mo with new exercise program.  History   Social History  . Marital Status: Married    Spouse Name: N/A  . Number of Children: 2  . 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 Smoking status: Never Smoker   . Smokeless tobacco: Never Used  . Alcohol Use: 0.6 oz/week    1 Glasses of wine per week  . Drug Use: No  . Sexual Activity: Yes    Birth Control/ Protection: Surgical   Other Topics Concern  . Not on file   Social History Narrative   Lives with husband & 2 kids. Works [employer].   Health Maintenance  Topic Date Due  . PAP SMEAR  04/19/1987  . TETANUS/TDAP  04/18/1988  . INFLUENZA VACCINE  08/07/2013    The following portions of the patient's history were reviewed and updated as appropriate: allergies, current medications, past family history, past medical history, past social history, past surgical history and problem list.  Review of Systems Constitutional: negative for fatigue and fevers Ears, nose, mouth, throat, and face: negative for sore throat Respiratory: negative for cough Cardiovascular: positive for lower extremity edema and improved when walks more. , negative for palpitations Gastrointestinal: negative for  abdominal pain, change in bowel habits, constipation, diarrhea and dyspepsia Genitourinary:negative for abnormal menstrual periods and had uterine ablation-no bleeding Integument/breast: negative for rash Musculoskeletal:negative for arthralgias   Objective:    BP 120/74 mmHg  Pulse 68  Temp(Src) 98.2 F (36.8 C) (Oral)  Ht 5' 3.5" (1.613 m)  Wt 201 lb (91.173 kg)  BMI 35.04 kg/m2  SpO2 97% General appearance: alert, cooperative, appears stated age, no distress and have to bring back to topic at hand Head: Normocephalic, without obvious abnormality, atraumatic Eyes: negative findings: lids and lashes normal, conjunctivae and sclerae normal, corneas clear and pupils equal, round, reactive to light and accomodation Ears: normal TM's and external ear canals both ears Throat: lips, mucosa, and tongue normal; teeth and gums normal Neck: no adenopathy, no carotid bruit, supple, symmetrical, trachea midline and thyroid not enlarged, symmetric, no tenderness/mass/nodules Lungs: clear to auscultation bilaterally Heart: regular rate and rhythm, S1, S2 normal, no murmur, click, rub or gallop Abdomen: soft, non-tender; bowel sounds normal; no masses,  no organomegaly Extremities: extremities normal, atraumatic, no cyanosis or edema Pulses: 2+ and symmetric Neurologic: Grossly normal    Assessment:Plan  1. Preventative health care - CBC with Differential/Platelet; Future - Comprehensive metabolic panel; Future - Hemoglobin A1c; Future - TSH; Future - Lipid panel; Future - Vit D  25 hydroxy (rtn osteoporosis monitoring); Future  F/u 1 yr, PRN labs

## 2014-02-22 ENCOUNTER — Other Ambulatory Visit (INDEPENDENT_AMBULATORY_CARE_PROVIDER_SITE_OTHER): Payer: BLUE CROSS/BLUE SHIELD

## 2014-02-22 DIAGNOSIS — Z0000 Encounter for general adult medical examination without abnormal findings: Secondary | ICD-10-CM

## 2014-02-22 LAB — CBC WITH DIFFERENTIAL/PLATELET
BASOS PCT: 0.2 % (ref 0.0–3.0)
Basophils Absolute: 0 10*3/uL (ref 0.0–0.1)
EOS PCT: 2 % (ref 0.0–5.0)
Eosinophils Absolute: 0.1 10*3/uL (ref 0.0–0.7)
HCT: 42.4 % (ref 36.0–46.0)
Hemoglobin: 14.3 g/dL (ref 12.0–15.0)
LYMPHS PCT: 30 % (ref 12.0–46.0)
Lymphs Abs: 2.2 10*3/uL (ref 0.7–4.0)
MCHC: 33.6 g/dL (ref 30.0–36.0)
MCV: 91.1 fl (ref 78.0–100.0)
Monocytes Absolute: 0.5 10*3/uL (ref 0.1–1.0)
Monocytes Relative: 6.2 % (ref 3.0–12.0)
NEUTROS PCT: 61.6 % (ref 43.0–77.0)
Neutro Abs: 4.5 10*3/uL (ref 1.4–7.7)
Platelets: 290 10*3/uL (ref 150.0–400.0)
RBC: 4.66 Mil/uL (ref 3.87–5.11)
RDW: 13 % (ref 11.5–15.5)
WBC: 7.3 10*3/uL (ref 4.0–10.5)

## 2014-02-22 LAB — LIPID PANEL
CHOLESTEROL: 233 mg/dL — AB (ref 0–200)
HDL: 55.7 mg/dL (ref >39.00)
LDL Cholesterol: 153 mg/dL — ABNORMAL HIGH (ref 0–99)
NONHDL: 177.3
Total CHOL/HDL Ratio: 4
Triglycerides: 124 mg/dL (ref 0.0–149.0)
VLDL: 24.8 mg/dL (ref 0.0–40.0)

## 2014-02-22 LAB — VITAMIN D 25 HYDROXY (VIT D DEFICIENCY, FRACTURES): VITD: 29.64 ng/mL — AB (ref 30.00–100.00)

## 2014-02-22 LAB — TSH: TSH: 1.89 u[IU]/mL (ref 0.35–4.50)

## 2014-02-22 LAB — COMPREHENSIVE METABOLIC PANEL
ALBUMIN: 4.2 g/dL (ref 3.5–5.2)
ALK PHOS: 56 U/L (ref 39–117)
ALT: 17 U/L (ref 0–35)
AST: 18 U/L (ref 0–37)
BILIRUBIN TOTAL: 0.5 mg/dL (ref 0.2–1.2)
BUN: 18 mg/dL (ref 6–23)
CO2: 26 meq/L (ref 19–32)
Calcium: 9.2 mg/dL (ref 8.4–10.5)
Chloride: 105 mEq/L (ref 96–112)
Creatinine, Ser: 0.85 mg/dL (ref 0.40–1.20)
GFR: 76.92 mL/min (ref >60.00)
GLUCOSE: 93 mg/dL (ref 70–99)
POTASSIUM: 4.6 meq/L (ref 3.5–5.1)
SODIUM: 137 meq/L (ref 135–145)
TOTAL PROTEIN: 6.9 g/dL (ref 6.0–8.3)

## 2014-02-22 LAB — HEMOGLOBIN A1C: Hgb A1c MFr Bld: 5.6 % (ref 4.6–6.5)

## 2014-02-23 ENCOUNTER — Telehealth: Payer: Self-pay | Admitting: Nurse Practitioner

## 2014-02-23 DIAGNOSIS — E559 Vitamin D deficiency, unspecified: Secondary | ICD-10-CM | POA: Insufficient documentation

## 2014-02-23 NOTE — Telephone Encounter (Signed)
pls call pt: Advise Labs look good! Vitamin d is low. Start D3 5000 iu qd with meal. Take for 12 weeks, then I will check level again. Pls schedule lab appt. Does not need to fast.

## 2014-02-23 NOTE — Telephone Encounter (Signed)
LMOVM for pt to return call 

## 2014-02-23 NOTE — Telephone Encounter (Signed)
Patient returned call and was given results. Patient expressed understanding. Patient will call back to schedule las appt.

## 2014-07-05 ENCOUNTER — Encounter: Payer: Self-pay | Admitting: Nurse Practitioner

## 2014-07-05 DIAGNOSIS — Z0000 Encounter for general adult medical examination without abnormal findings: Secondary | ICD-10-CM | POA: Insufficient documentation

## 2014-08-24 ENCOUNTER — Other Ambulatory Visit: Payer: BLUE CROSS/BLUE SHIELD

## 2014-08-24 ENCOUNTER — Ambulatory Visit: Payer: BLUE CROSS/BLUE SHIELD | Admitting: Family Medicine

## 2014-09-15 ENCOUNTER — Encounter: Payer: Self-pay | Admitting: Family Medicine

## 2014-09-15 ENCOUNTER — Ambulatory Visit (INDEPENDENT_AMBULATORY_CARE_PROVIDER_SITE_OTHER): Payer: BLUE CROSS/BLUE SHIELD | Admitting: Family Medicine

## 2014-09-15 VITALS — BP 116/79 | HR 73 | Temp 98.8°F | Resp 18 | Wt 194.8 lb

## 2014-09-15 DIAGNOSIS — E559 Vitamin D deficiency, unspecified: Secondary | ICD-10-CM | POA: Diagnosis not present

## 2014-09-15 DIAGNOSIS — F419 Anxiety disorder, unspecified: Secondary | ICD-10-CM | POA: Diagnosis not present

## 2014-09-15 DIAGNOSIS — F988 Other specified behavioral and emotional disorders with onset usually occurring in childhood and adolescence: Secondary | ICD-10-CM | POA: Insufficient documentation

## 2014-09-15 DIAGNOSIS — F909 Attention-deficit hyperactivity disorder, unspecified type: Secondary | ICD-10-CM | POA: Diagnosis not present

## 2014-09-15 LAB — VITAMIN D 25 HYDROXY (VIT D DEFICIENCY, FRACTURES): VITD: 51.48 ng/mL (ref 30.00–100.00)

## 2014-09-15 NOTE — Progress Notes (Signed)
Subjective:    Patient ID: Carmen Robinson, female    DOB: [birth year], 44 y.o.   MRN: [id number]  HPI  Generalized anxiety disorder: Patient states she has a history of generalized anxiety disorder since her last delivery in 2003 or she had postpartum depression and anxiety. She was started on Effexor 75 mg at that time by her OB. She was tried on the 150 mg of Effexor, but experience palpitations. She was started on Lamictal, and currently takes 150 mg daily. She reports she's been on 150 mg dose of Lamictal for approximately 3 years. She used to be seen at Dr. Molly Maduro office by a extender provider, which she states told her she had a mood disorder and started the Lamictal. She does not feel that she has a mood disorder, and does not think that she is bipolar. She reports there is no family history of bipolar. She is also on trazodone 100 mg because she is unable to sleep. She states she does not want to be on all these medications, and would like to not have to go to psychiatry to have her anxiety treated.  ADD: Patient has recent diagnosis with the last year of attention deficit. She had been started on Adderall 10 mg 24 tab, and was having difficulty falling asleep on this medication. In addition she has anxiety, that preceded the start of the stimulant medication. Her provider is transferring positions, and is no longer able to care for her. She is to start and phentermine salts 10 mg tab (Eveko) today in place of the 24-hour tab. Patient is needing a new provider to take over her attention deficit treatment.  Vitamin D deficiency: Patient had finished her supplementation of vitamin D, and is present for repeat vitamin D levels today. Patient was taking 5000iu of D3 daily. She tolerated supplementation without side effects. She still endorses some fatigue.  Past Medical History  Diagnosis Date  . Anxiety   . Kidney stones   . Hyperlipidemia   . Mood disorder    Allergies  Allergen  Reactions  . Penicillins   . Shellfish Allergy    Past Surgical History  Procedure Laterality Date  . Endometrial ablation    . Ablation      uterine  . Cesarean section     Social History   Social History  . Marital Status: Married    Spouse Name: N/A  . Number of Children: 2  . 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 Smoking status: Never Smoker   . Smokeless tobacco: Never Used  . Alcohol Use: 0.6 oz/week    1 Glasses of wine per week  . Drug Use: No  . Sexual Activity: Yes    Birth Control/ Protection: Surgical   Other Topics Concern  . Not on file   Social History Narrative   Lives with husband & 2 kids. Works [employer].    Review of Systems Negative, with the exception of above mentioned in HPI     Objective:   Physical Exam BP 116/79 mmHg  Pulse 73  Temp(Src) 98.8 F (37.1 C) (Oral)  Resp 18  Wt 194 lb 12.8 oz (88.361 kg)  Body mass index is 33.96 kg/(m^2). Gen: Afebrile. No acute distress. Nontoxic appearance, well-developed, well-nourished, Caucasian female. Mildly obese. HENT: AT. [place]. MMM.  Eyes:Pupils Equal Round Reactive to light, Extraocular movements intact,  Conjunctiva without  redness, discharge or icterus. CV: RRR 1/6 SM appreciated, No edema, +2/4 P posterior tibialis pulses Chest: CTAB, no wheeze or crackles Skin: No rashes, purpura or petechiae.  Neuro: Normal gait. PERLA. EOMi. Alert. Oriented x3.  Psych: Normal affect, dress and demeanor. Normal speech. Normal thought content and judgment..      Assessment & Plan:  1. ADD (attention deficit disorder) - Patient losing psychology provider secondary to her provider switching jobs. Discussed referral to [place] attention specialist today. Patient is to start her EVEKO today, which was prescribed by the prior provider.  - Ambulatory referral to Psychology  2. Vitamin D deficiency - Vit D  25 hydroxy (rtn osteoporosis monitoring),  patient will be called with results once they become available. Vitamin D is still low, will prescribe medication.  3. Anxiety - Detailed discussion on patient's anxiety, and anxiety history. She has been cared for by psychiatry, but has lost her provider secondary to the provider transferring jobs. - Records have been requested, and will be reviewed in detail once received. - Per patient's report, she has been on the same medication for at least 2-3 years and would like to be managed here. Patient currently taking Effexor 75 mg, Lamictal 150 mg and trazodone 100 mg.  - Continue Effexor 75 mg, Lamictal 150 mg and trazodone 100 mg. Patient will follow-up in 4 weeks, getting me time to receive her records and review. Patient will require FMLA papers to be filled out before November, once management is taken over. And return visit patient will need depression screening, anxiety assessment, and mood disorder assessment. - Patient will need a 30 minute appointment, in 4 weeks.  > 25 minutes spent with patient, >50% of time spent face to face counseling patient and coordinating care.

## 2014-09-15 NOTE — Patient Instructions (Signed)
Please have records sent to [place] so I can review and then follow up in 4 weeks. We will discuss your anxiety then and make any adjustments needed to medications.  ADD referral to [place] [facility]ist .   Generalized Anxiety Disorder Generalized anxiety disorder (GAD) is a mental disorder. It interferes with life functions, including relationships, work, and school. GAD is different from normal anxiety, which everyone experiences at some point in their lives in response to specific life events and activities. Normal anxiety actually helps [place] prepare for and get through these life events and activities. Normal anxiety goes away after the event or activity is over.  GAD causes anxiety that is not necessarily related to specific events or activities. It also causes excess anxiety in proportion to specific events or activities. The anxiety associated with GAD is also difficult to control. GAD can vary from mild to severe. People with severe GAD can have intense waves of anxiety with physical symptoms (panic attacks).  SYMPTOMS The anxiety and worry associated with GAD are difficult to control. This anxiety and worry are related to many life events and activities and also occur more days than not for 6 months or longer. People with GAD also have three or more of the following symptoms (one or more in children):  Restlessness.   Fatigue.  Difficulty concentrating.   Irritability.  Muscle tension.  Difficulty sleeping or unsatisfying sleep. DIAGNOSIS GAD is diagnosed through an assessment by your health care provider. Your health care provider will ask you questions aboutyour mood,physical symptoms, and events in your life. Your health care provider may ask you about your medical history and use of alcohol or drugs, including prescription medicines. Your health care provider may also do a physical exam and blood tests. Certain medical conditions and the use of certain substances can cause  symptoms similar to those associated with GAD. Your health care provider may refer you to a mental health specialist for further evaluation. TREATMENT The following therapies are usually used to treat GAD:   Medication. Antidepressant medication usually is prescribed for long-term daily control. Antianxiety medicines may be added in severe cases, especially when panic attacks occur.   Talk therapy (psychotherapy). Certain types of talk therapy can be helpful in treating GAD by providing support, education, and guidance. A form of talk therapy called cognitive behavioral therapy can teach you healthy ways to think about and react to daily life events and activities.  Stress managementtechniques. These include yoga, meditation, and exercise and can be very helpful when they are practiced regularly. A mental health specialist can help determine which treatment is best for you. Some people see improvement with one therapy. However, other people require a combination of therapies. Document Released: 04/20/2012 Document Revised: 05/10/2013 Document Reviewed: 04/20/2012 [facility] Patient Information 2015 [place], [place]. This information is not intended to replace advice given to you by your health care provider. Make sure you discuss any questions you have with your health care provider.

## 2014-10-13 ENCOUNTER — Encounter: Payer: Self-pay | Admitting: Family Medicine

## 2014-10-13 ENCOUNTER — Ambulatory Visit (INDEPENDENT_AMBULATORY_CARE_PROVIDER_SITE_OTHER): Payer: BLUE CROSS/BLUE SHIELD | Admitting: Family Medicine

## 2014-10-13 VITALS — BP 120/82 | HR 86 | Temp 98.8°F | Resp 18 | Ht 63.5 in | Wt 196.0 lb

## 2014-10-13 DIAGNOSIS — F32A Depression, unspecified: Secondary | ICD-10-CM | POA: Insufficient documentation

## 2014-10-13 DIAGNOSIS — F419 Anxiety disorder, unspecified: Secondary | ICD-10-CM | POA: Diagnosis not present

## 2014-10-13 DIAGNOSIS — Z0000 Encounter for general adult medical examination without abnormal findings: Secondary | ICD-10-CM

## 2014-10-13 DIAGNOSIS — F909 Attention-deficit hyperactivity disorder, unspecified type: Secondary | ICD-10-CM | POA: Diagnosis not present

## 2014-10-13 DIAGNOSIS — G4700 Insomnia, unspecified: Secondary | ICD-10-CM

## 2014-10-13 DIAGNOSIS — F329 Major depressive disorder, single episode, unspecified: Secondary | ICD-10-CM

## 2014-10-13 DIAGNOSIS — Z23 Encounter for immunization: Secondary | ICD-10-CM

## 2014-10-13 DIAGNOSIS — F988 Other specified behavioral and emotional disorders with onset usually occurring in childhood and adolescence: Secondary | ICD-10-CM

## 2014-10-13 MED ORDER — TRAZODONE HCL 100 MG PO TABS: 100.0000 mg | ORAL_TABLET | Freq: Every day | ORAL | Status: DC

## 2014-10-13 MED ORDER — FLUOXETINE HCL 20 MG PO CAPS: 20.0000 mg | ORAL_CAPSULE | Freq: Every day | ORAL | Status: DC

## 2014-10-13 NOTE — Progress Notes (Signed)
Subjective:    Patient ID: Carmen Robinson, female    DOB: 1970/06/13, 44 y.o.   MRN: [id number]  HPI Patient presents for follow up on anxiety/Depresson today  ADD: Patient is starting with attention specialist within 1-2 weeks. She states that she did start the new medicine Evekeo, that her prior doctor had given her, and she was unable to tolerate it. She stated made her jittery, "wound me up " so she is only taking half of the 10 mg EVEKO TAB. Of note patient reports that she did not need to take trazodone, and fell her anxiety was more controlled when not on stimulant medication.  Insomnia: Patient states that her insomnia started when she started on the stimulant medication for her ADD. Since that time she has been prescribed trazodone by her prior provider to help her fall asleep at night. Patient is currently using 100 mg daily at bedtime of trazodone. She states she has difficulty falling asleep, and sometimes she feels like she needs more sleep than other individuals. She remains tired. She goes to bed at 10:00 PM every night and wakes up around 6 AM every day. She states she has good sleep hygiene when reviewed.   Anxiety: Patient is currently Taking trazodone 100 mg, Effexor 75, lamictal 150 mg. She states that she now attempting to walk at least 3-4 times a week with a friend of hers and she feels like this is helping with her anxiety as well. She states with the weather getting cooler, she starting to feel worked up in the house, since she works from home. She has concerns that now that her company is transitioning, she may have to report to work instead a working from home. This is a concern for her because when she did work from the office, she had trouble focusing and had increased anxiety. Patient's prior dose of Effexor was 150 mg, but she states she could not tolerate it secondary to having heart palpitations. - Mood disorder questionnaire:  today was completed and patient  endorsed getting much asleep in usual and found she didn't really miss it. She is much more talkative spoke much faster than usual. She had racing thoughts and couldn't slow down her mind. She was easily distracted and trouble concentrating. She had much more energy than usual. She answered "no" to the above questions happening at the same. Time. She reports some minor problem for her. She does have a family history of manic depressive/bipolar disorder. Her prior provider told her that she has a "mood disorder ". - Depression screening questionnaire: Everyday patient has a poor appetite/overeating, trouble concentrating, moving or speaking slowly the people have noticed or being fidgety or restless. When half the days she feels tired has little energy, feels bad about herself and feels that she is a failure. Several days she feels down, depressed or hopeless. Trouble falling or staying asleep or sleeping too much. - Anxiety disorder questionnaire: - Nearly every day she's not able to stop or control her worrying, she worries too much about different things, she has trouble relaxing, she feels nervous, anxious or on edge. she feels restless with a participant still, she becomes easily annoyed and irritable.   Review of Systems Negative, with the exception of above mentioned in HPI     Objective:   Physical Exam BP 120/82 mmHg  Pulse 86  Temp(Src) 98.8 F (37.1 C) (Temporal)  Resp 18  Ht 5' 3.5" (1.613 m)  Wt 196 lb (88.905  kg)  BMI 34.17 kg/m2  SpO2 97% Gen: Afebrile. No acute distress. Nontoxic in appearance, well-developed, well-nourished, Caucasian female, obese. Talkative. Eyes:Pupils Equal Round Reactive to light, Extraocular movements intact,  Conjunctiva without redness, discharge or icterus. CV: RRR  Psych: Normal affect, dress and demeanor. Normal speech. Normal thought content and judgment.  Screening tests: GAD 16 Generalized anxiety; "somewhat difficult" PHQ: 15 moderately  severe depression; "somewhat difficult" Mood disorder: Negative. (5 of 7 needed for positive), No answer to #2, "Minor problem"    Assessment & Plan:  1. Depression - Patient with depression screening today with moderately to severe depression,  anxiety screening still remains high at 16, despite therapy. Mood disorder screening was negative today, unknown prior screening never received records from psychiatry. Mood disorder not ruled out considering patient is on Lamictal and is likely stabilizing any underlying mood disorder. - Continue Effexor 75 mg, continue Lamictal 150 mg, continue trazodone 100 mg. - Add Prozac today at low dose of 20 mg, may need to taper this to 40 mg on next visit. - FLUoxetine (PROZAC) 20 MG capsule; Take 1 capsule (20 mg total) by mouth daily.  Dispense: 30 capsule; Refill: 3 - Follow-up in 8 weeks.  2. Preventative health care/maintenance/Need for prophylactic vaccination and inoculation against influenza - Flu Vaccine QUAD 36+ mos IM  3. Anxiety - Long discussion with patient today concerning her increased anxiety. Anxiety screening remains elevated despite therapy. Part of her anxiety increases could be secondary to her ADD medications. Patient is referred to hand and attention specialist for her ADD medication, and will be starting there within 1-2 weeks. I've advised her that she may need to discuss use of non-stimulant medications for her ADD, this could decrease her anxiety. We have added Prozac to her regimen today.  4. ADD (attention deficit disorder) -  Patient starts at the attention specialist in 1-2 weeks. Advised her she may need to request a non-stimulant medication, she still having difficulty with sleeping and her anxiety is still increased despite therapy.  5. Insomnia: - Discussed sleep hygiene today, patient seems to have a decent sleep hygiene. Insomnia seems to have started after the start of her stomach medications for ADD. Patient is following  up with [facility] specialist in 1-2 weeks. I have encouraged her to consider a non-stimulant medication for her attention deficit. - If patient goes to a non-stimulant medication, may consider discontinuing trazodone, as trazodone was started after her stimulant was started. - Trazodone refills today.  Follow-up in 8 weeks. Sooner if needed. Preventative visit in February.  > 25 minutes spent with patient, >50% of time spent face to face counseling patient and coordinating care.

## 2014-10-13 NOTE — Patient Instructions (Addendum)
Generalized Anxiety Disorder Generalized anxiety disorder (GAD) is a mental disorder. It interferes with life functions, including relationships, work, and school. GAD is different from normal anxiety, which everyone experiences at some point in their lives in response to specific life events and activities. Normal anxiety actually helps [place] prepare for and get through these life events and activities. Normal anxiety goes away after the event or activity is over.  GAD causes anxiety that is not necessarily related to specific events or activities. It also causes excess anxiety in proportion to specific events or activities. The anxiety associated with GAD is also difficult to control. GAD can vary from mild to severe. People with severe GAD can have intense waves of anxiety with physical symptoms (panic attacks).  SYMPTOMS The anxiety and worry associated with GAD are difficult to control. This anxiety and worry are related to many life events and activities and also occur more days than not for 6 months or longer. People with GAD also have three or more of the following symptoms (one or more in children):  Restlessness.   Fatigue.  Difficulty concentrating.   Irritability.  Muscle tension.  Difficulty sleeping or unsatisfying sleep. DIAGNOSIS GAD is diagnosed through an assessment by your health care provider. Your health care provider will ask you questions aboutyour mood,physical symptoms, and events in your life. Your health care provider may ask you about your medical history and use of alcohol or drugs, including prescription medicines. Your health care provider may also do a physical exam and blood tests. Certain medical conditions and the use of certain substances can cause symptoms similar to those associated with GAD. Your health care provider may refer you to a mental health specialist for further evaluation. TREATMENT The following therapies are usually used to treat GAD:    Medication. Antidepressant medication usually is prescribed for long-term daily control. Antianxiety medicines may be added in severe cases, especially when panic attacks occur.   Talk therapy (psychotherapy). Certain types of talk therapy can be helpful in treating GAD by providing support, education, and guidance. A form of talk therapy called cognitive behavioral therapy can teach you healthy ways to think about and react to daily life events and activities.  Stress managementtechniques. These include yoga, meditation, and exercise and can be very helpful when they are practiced regularly. A mental health specialist can help determine which treatment is best for you. Some people see improvement with one therapy. However, other people require a combination of therapies.   This information is not intended to replace advice given to you by your health care provider. Make sure you discuss any questions you have with your health care provider.   Document Released: 04/20/2012 Document Revised: 01/14/2014 Document Reviewed: 04/20/2012 Elsevier Interactive Patient Education 2016 [employer].  Follow up 2 months Start Fluoxetine 20 mg daily.

## 2014-10-13 NOTE — Progress Notes (Signed)
Pre visit review using our clinic review tool, if applicable. No additional management support is needed unless otherwise documented below in the visit note. 

## 2014-12-08 ENCOUNTER — Ambulatory Visit: Payer: BLUE CROSS/BLUE SHIELD | Admitting: Family Medicine

## 2014-12-26 ENCOUNTER — Ambulatory Visit: Payer: BLUE CROSS/BLUE SHIELD | Admitting: Family Medicine

## 2015-01-16 ENCOUNTER — Other Ambulatory Visit: Payer: Self-pay | Admitting: *Deleted

## 2015-01-16 ENCOUNTER — Ambulatory Visit: Payer: BLUE CROSS/BLUE SHIELD | Admitting: Family Medicine

## 2015-01-16 MED ORDER — VENLAFAXINE HCL ER 75 MG PO CP24: 75.0000 mg | ORAL_CAPSULE | Freq: Every day | ORAL | Status: DC

## 2015-01-16 NOTE — Telephone Encounter (Signed)
Patient appt had to be rescheduled due to weather conditions. Sent 1 refill on patient Effexor to patient pharmacy to get her through till her appt.

## 2015-02-06 ENCOUNTER — Encounter: Payer: Self-pay | Admitting: Family Medicine

## 2015-02-06 ENCOUNTER — Ambulatory Visit (INDEPENDENT_AMBULATORY_CARE_PROVIDER_SITE_OTHER): Payer: Managed Care, Other (non HMO) | Admitting: Family Medicine

## 2015-02-06 VITALS — BP 127/76 | HR 76 | Temp 98.4°F | Resp 20 | Wt 203.8 lb

## 2015-02-06 DIAGNOSIS — F419 Anxiety disorder, unspecified: Secondary | ICD-10-CM | POA: Diagnosis not present

## 2015-02-06 DIAGNOSIS — F329 Major depressive disorder, single episode, unspecified: Secondary | ICD-10-CM | POA: Diagnosis not present

## 2015-02-06 DIAGNOSIS — F32A Depression, unspecified: Secondary | ICD-10-CM

## 2015-02-06 MED ORDER — VENLAFAXINE HCL ER 75 MG PO CP24: 75.0000 mg | ORAL_CAPSULE | Freq: Every day | ORAL | Status: DC

## 2015-02-06 MED ORDER — TRAZODONE HCL 100 MG PO TABS: 200.0000 mg | ORAL_TABLET | Freq: Every day | ORAL | Status: DC

## 2015-02-06 MED ORDER — FLUOXETINE HCL 20 MG PO CAPS: 20.0000 mg | ORAL_CAPSULE | Freq: Every day | ORAL | Status: DC

## 2015-02-06 NOTE — Progress Notes (Signed)
Patient ID: Carmen Robinson, female   DOB: [birth year], 45 y.o.   MRN: [id number]   Subjective:    Patient ID: Carmen Robinson, female    DOB: May 01, 1970, 45 y.o.   MRN: [id number]  HPI Patient presents for follow up on anxiety/Depresson today.  Insomnia:  Patient is currently using 200 mg daily at bedtime of trazodone. She is off ADD  Medications.  She states she has good sleep hygiene when reviewed. She is prescribed 100  Mg of trazodone but has been taking 200 mg and feels that is working for her.   Anxiety: Patient is currently Taking trazodone 200 mg, Effexor 75, and prozac 20 mg. She was tapered of Lamictal.  lShe has concerns that now that her company is transitioning, she may have to report to work instead a working from home. transition phase 1 by March, she is giving it a year.  Patient's prior dose of Effexor was 150 mg, but she states she could not tolerate it secondary to having heart palpitations.  Past Medical History  Diagnosis Date  . Anxiety   . Kidney stones   . Hyperlipidemia   . Mood disorder (HCC)    Allergies  Allergen Reactions  . Iodinated Diagnostic Agents Swelling  . Penicillins   . Shellfish Allergy    Social History   Social History  . Marital Status: Married    Spouse Name: N/A  . Number of Children: 2  . 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 Smoking status: Never Smoker   . Smokeless tobacco: Never Used  . Alcohol Use: 0.6 oz/week    1 Glasses of wine per week  . Drug Use: No  . Sexual Activity: Yes    Birth Control/ Protection: Surgical   Other Topics Concern  . Not on file   Social History Narrative   Lives with husband & 2 kids. Works [employer].      Review of Systems Negative, with the exception of above mentioned in HPI     Objective:   Physical Exam BP 127/76 mmHg  Pulse 76  Temp(Src) 98.4 F (36.9 C) (Oral)  Resp 20  Wt 203 lb 12 oz (92.42 kg)  SpO2 97% Gen:  Afebrile. No acute distress. Nontoxic in appearance, well-developed, well-nourished, Caucasian female, obese. Talkative. Eyes:Pupils Equal Round Reactive to light, Extraocular movements intact,  Conjunctiva without redness, discharge or icterus. CV: RRR  Psych: Normal affect, dress and demeanor. Normal speech. Normal thought content and judgment.     Assessment & Plan:  Depression/ Anxiety - Now off Add meds - Better controlled.  - [facility]    Insomnia: - Increase trazodone to 200 mg QHS, [facility].  - F/U 6 months  CPE with PAP in March.   > 25 minutes spent with patient, >50% of time spent face to face counseling patient and coordinating care.

## 2015-02-06 NOTE — Patient Instructions (Addendum)
[facility] and 1 refill.  Followup in 6 months or sooner if needed. Physical with PAP in March with fasting labs prior.

## 2015-04-10 ENCOUNTER — Encounter: Payer: Self-pay | Admitting: Family Medicine

## 2015-04-10 DIAGNOSIS — M5030 Other cervical disc degeneration, unspecified cervical region: Secondary | ICD-10-CM | POA: Insufficient documentation

## 2015-06-01 ENCOUNTER — Other Ambulatory Visit: Payer: Self-pay | Admitting: Orthopedic Surgery

## 2015-06-01 DIAGNOSIS — M5134 Other intervertebral disc degeneration, thoracic region: Secondary | ICD-10-CM

## 2015-06-04 ENCOUNTER — Ambulatory Visit
Admission: RE | Admit: 2015-06-04 | Discharge: 2015-06-04 | Disposition: A | Discharge status: Home / Self Care | Payer: Managed Care, Other (non HMO) | Source: Ambulatory Visit | Attending: Orthopedic Surgery | Admitting: Orthopedic Surgery

## 2015-06-04 DIAGNOSIS — M5134 Other intervertebral disc degeneration, thoracic region: Secondary | ICD-10-CM

## 2015-08-03 ENCOUNTER — Telehealth: Payer: Self-pay | Admitting: Family Medicine

## 2015-08-03 NOTE — Telephone Encounter (Signed)
Patient is requesting referral to Dr. Jacqualine Mau Neurology Surgery. She is currently seeing a physician at [facility] but the patient feels like this may be neurological since she has tingling pains running down her arm.

## 2015-08-04 NOTE — Telephone Encounter (Signed)
Pt requesting referral to neurology. Unfortunately I have never evaluated her for the condition she is asking for a referral. I would need her to schedule an appt for me to evaluate her, in order to refer her to specialist if indicated.

## 2015-08-04 NOTE — Telephone Encounter (Signed)
Spoke with patient scheduled appt for evaluation . 

## 2015-08-07 ENCOUNTER — Encounter: Payer: Self-pay | Admitting: Family Medicine

## 2015-08-07 ENCOUNTER — Ambulatory Visit (INDEPENDENT_AMBULATORY_CARE_PROVIDER_SITE_OTHER): Payer: Managed Care, Other (non HMO) | Admitting: Family Medicine

## 2015-08-07 VITALS — BP 131/87 | HR 74 | Temp 98.2°F | Resp 20 | Ht 64.0 in | Wt 205.5 lb

## 2015-08-07 DIAGNOSIS — M5030 Other cervical disc degeneration, unspecified cervical region: Secondary | ICD-10-CM

## 2015-08-07 DIAGNOSIS — M5010 Cervical disc disorder with radiculopathy, unspecified cervical region: Secondary | ICD-10-CM

## 2015-08-07 NOTE — Patient Instructions (Addendum)
I referred you to neurosurgeon.  Continue Lyrica.  Follow as needed.

## 2015-08-07 NOTE — Progress Notes (Signed)
Carmen Robinson , 1970/05/04, 45 y.o., female MRN: [id number] Patient Care Team    Relationship Specialty Notifications Start End  Ma Hillock, DO PCP - General Family Medicine  09/15/14     CC: Cervical spine pain Subjective: Pt presents for an  OV with complaints of neck pain with radiation to her bilateral shoulders and left arm/hand. Patient has been under the care of Dr. Rolena Infante (ortho) since November 2016 for this condition. She states over the last few weeks her pain is radiating down left arm to hand and right shoulder, this is a progression from prior. She was recently started on Lyrica by the PA she saw at her orthopedics office last week.  She has had 2 spinal injections, one which she states worked, but only for about a week. They have been trying to hold off on surgery, because she does not want a spinal fusion and is concerned over procedure. She would like a second opinion. She states her quality of life is diminished and she is sometimes unable to get out of bed. She states she has had problems for over 10 years. However it was tolerable until last October. She reports an injury/fall in October that may have triggered the problem when she fell on outstretched arms. She also had an event in November when she slipped off a chair. She is taking tramadol for her arm pain, prescribed by orthopedics. She does not want to take tramadol for a long period time. She is concerned over the progression on her symptoms and would like to be referred to neurology for a second opinion.    MRI 05/2015 cervical spine: CLINICAL DATA:  45 year old female with chronic neck pain worsening since November 2016 when fell. Bilateral arm pain. Initial encounter.  EXAM: MRI CERVICAL SPINE WITHOUT CONTRAST  TECHNIQUE: Multiplanar, multisequence MR imaging of the cervical spine was performed. No intravenous contrast was administered.  COMPARISON:  None.  FINDINGS: Alignment: Gentle kyphosis  cervical spine centered C5 level.  Vertebrae: Edema surrounding the C5-6 disc space consistent with discogenic changes.  Cord: No abnormal signal.  Normal caliber.  Posterior Fossa, vertebral arteries, paraspinal tissues: Within normal limits.  Disc levels:  C2-3:  Negative.   C3-4: Shallow disc osteophyte complex slightly greater to the right. Narrowing ventral thecal sac without cord contact. Uncinate hypertrophy with mild left-sided and mild-to-moderate right-sided foraminal narrowing.  C4-5: Broad-based disc osteophyte complex greater to left. Narrowing ventral thecal sac greater on left with minimal left-sided cord flattening. Uncinate hypertrophy with mild to moderate bilateral foraminal narrowing. Vertebral arteries extend slightly into the neural foramen.  C5-6: Moderate sized broad-based disc osteophyte complex greater to left. Narrowing ventral thecal sac. Mild cord flattening greater on the left. Uncinate hypertrophy with moderate to marked left-sided and moderate right-sided foraminal narrowing.  C6-7:  Minimal bulge.  Minimal narrowing central ventral thecal sac.  C7-T1: Negative.  IMPRESSION: C5-6 moderate sized broad-based disc osteophyte complex greater to left. Narrowing ventral thecal sac. Mild cord flattening greater on the left. Moderate to marked left-sided and moderate right-sided foraminal narrowing. C4-5 broad-based disc osteophyte complex greater to left. Narrowing ventral thecal sac greater on left with minimal left-sided cord flattening. Mild to moderate bilateral foraminal narrowing. Vertebral arteries extend slightly into the neural foramen. C3-4 shallow disc osteophyte complex slightly greater to the right. Narrowing ventral thecal sac without cord contact. Mild left-sided and mild-to-moderate right-sided foraminal narrowing. C6-7 minimal bulge.  Allergies  Allergen Reactions  . Iodinated Diagnostic Agents  Swelling  .  Penicillins   . Shellfish Allergy    Social History  Substance Use Topics  . Smoking status: Never Smoker  . Smokeless tobacco: Never Used  . Alcohol use 0.6 oz/week    1 Glasses of wine per week   Past Medical History:  Diagnosis Date  . Anxiety   . Hyperlipidemia   . Kidney stones   . Mood disorder ([place])   . Neck pain    C4-C5   Past Surgical History:  Procedure Laterality Date  . ABLATION     uterine  . CESAREAN SECTION    . ENDOMETRIAL ABLATION     Family History  Problem Relation Age of Onset  . Hyperlipidemia Mother   . Fibromyalgia Mother   . Irritable bowel syndrome Mother   . Arthritis Mother   . Diabetes Father   . Asthma Son   . Heart disease Maternal Aunt   . Aneurysm Maternal Aunt   . AAA (abdominal aortic aneurysm) Maternal Uncle   . Stroke Maternal Grandfather   . Heart disease Paternal Grandfather   . Bipolar disorder Brother   . Diabetes Brother   . ADD / ADHD Son      Medication List       Accurate as of 08/07/15 10:08 AM. Always use your most recent med list.          FLUoxetine 20 MG capsule Commonly known as:  PROZAC Take 1 capsule (20 mg total) by mouth daily.   traMADol 50 MG tablet Commonly known as:  ULTRAM   traZODone 100 MG tablet Commonly known as:  DESYREL Take 2 tablets (200 mg total) by mouth at bedtime.   venlafaxine XR 75 MG 24 hr capsule Commonly known as:  EFFEXOR-XR Take 1 capsule (75 mg total) by mouth daily with breakfast.       No results found for this or any previous visit (from the past 24 hour(s)). No results found.   ROS: Negative, with the exception of above mentioned in HPI   Objective:  BP 131/87 (BP Location: Right Arm, Patient Position: Sitting, Cuff Size: Large)   Pulse 74   Temp 98.2 F (36.8 C) (Oral)   Resp 20   Ht 5\' 4"  (1.626 m)   Wt 205 lb 8 oz (93.2 kg)   SpO2 96%   BMI 35.27 kg/m  Body mass index is 35.27 kg/m. Gen: Afebrile. No acute distress. Nontoxic in appearance,  well developed, well nourished.  HENT: AT. .  MMM Eyes:Pupils Equal Round Reactive to light, Extraocular movements intact,  Conjunctiva without redness, discharge or icterus. Neuro/MSK: Normal gait. PERLA. EOMi. Alert. Oriented x3 Cranial nerves II through XII intact. No TTP cervical spine, bicep tendon, or shoulder tender points. Mild decrease ROM right rotation and extension, otherwise normal ROM cervical spine. Muscle strength 5/5 right UE, 4+/5 flexion and grip strength left UE. Neg empty can test bilaterally. NV intact distally.  Psych: Normal affect, dress and demeanor. Normal speech. Normal thought content and judgment.  Assessment/Plan: ALLISIA BUSBEY is a 45 y.o. female present for OV  Degenerative disc disease, cervical with radiculopathy.  - MRI results reviewed.  - Continue Lyrica, may need to taper up on this medication. However she just started med 4 day ago.  - Referral to neurologist (Dr. Ellene Route) placed for 2nd opinion.  - F/U as needed   electronically signed by:  Howard Pouch, DO  [place]

## 2015-08-24 ENCOUNTER — Other Ambulatory Visit: Payer: Self-pay | Admitting: Family Medicine

## 2015-08-24 NOTE — Telephone Encounter (Signed)
Patient requesting RF for venlafaxine.  Sent Rx to pharmacy.  LMOM for patient to CB to schedule CPE that is due and she wouldn't be able to get further refills without appointment.

## 2015-09-05 ENCOUNTER — Other Ambulatory Visit (HOSPITAL_COMMUNITY)
Admission: RE | Admit: 2015-09-05 | Discharge: 2015-09-05 | Disposition: A | Discharge status: Home / Self Care | Payer: Managed Care, Other (non HMO) | Source: Ambulatory Visit | Attending: Family Medicine | Admitting: Family Medicine

## 2015-09-05 ENCOUNTER — Encounter: Payer: Self-pay | Admitting: Family Medicine

## 2015-09-05 ENCOUNTER — Ambulatory Visit (INDEPENDENT_AMBULATORY_CARE_PROVIDER_SITE_OTHER): Payer: Managed Care, Other (non HMO) | Admitting: Family Medicine

## 2015-09-05 VITALS — BP 116/77 | HR 71 | Temp 98.0°F | Resp 20 | Ht 64.0 in | Wt 204.5 lb

## 2015-09-05 DIAGNOSIS — Z1151 Encounter for screening for human papillomavirus (HPV): Secondary | ICD-10-CM | POA: Insufficient documentation

## 2015-09-05 DIAGNOSIS — F419 Anxiety disorder, unspecified: Secondary | ICD-10-CM

## 2015-09-05 DIAGNOSIS — Z130 Encounter for screening for diseases of the blood and blood-forming organs and certain disorders involving the immune mechanism: Secondary | ICD-10-CM | POA: Diagnosis not present

## 2015-09-05 DIAGNOSIS — Z124 Encounter for screening for malignant neoplasm of cervix: Secondary | ICD-10-CM

## 2015-09-05 DIAGNOSIS — Z0000 Encounter for general adult medical examination without abnormal findings: Secondary | ICD-10-CM | POA: Diagnosis not present

## 2015-09-05 DIAGNOSIS — Z131 Encounter for screening for diabetes mellitus: Secondary | ICD-10-CM | POA: Diagnosis not present

## 2015-09-05 DIAGNOSIS — Z01419 Encounter for gynecological examination (general) (routine) without abnormal findings: Secondary | ICD-10-CM | POA: Insufficient documentation

## 2015-09-05 DIAGNOSIS — Z79899 Other long term (current) drug therapy: Secondary | ICD-10-CM | POA: Diagnosis not present

## 2015-09-05 DIAGNOSIS — Z114 Encounter for screening for human immunodeficiency virus [HIV]: Secondary | ICD-10-CM | POA: Diagnosis not present

## 2015-09-05 DIAGNOSIS — Z13220 Encounter for screening for lipoid disorders: Secondary | ICD-10-CM | POA: Diagnosis not present

## 2015-09-05 DIAGNOSIS — F32A Depression, unspecified: Secondary | ICD-10-CM

## 2015-09-05 DIAGNOSIS — F329 Major depressive disorder, single episode, unspecified: Secondary | ICD-10-CM

## 2015-09-05 DIAGNOSIS — Z1329 Encounter for screening for other suspected endocrine disorder: Secondary | ICD-10-CM

## 2015-09-05 DIAGNOSIS — Z23 Encounter for immunization: Secondary | ICD-10-CM

## 2015-09-05 LAB — CBC WITH DIFFERENTIAL/PLATELET
BASOS ABS: 0 10*3/uL (ref 0.0–0.1)
Basophils Relative: 0.3 % (ref 0.0–3.0)
EOS ABS: 0.1 10*3/uL (ref 0.0–0.7)
Eosinophils Relative: 1.8 % (ref 0.0–5.0)
HCT: 41.4 % (ref 36.0–46.0)
HEMOGLOBIN: 14.2 g/dL (ref 12.0–15.0)
LYMPHS ABS: 1.6 10*3/uL (ref 0.7–4.0)
Lymphocytes Relative: 24.4 % (ref 12.0–46.0)
MCHC: 34.3 g/dL (ref 30.0–36.0)
MCV: 91 fl (ref 78.0–100.0)
MONO ABS: 0.4 10*3/uL (ref 0.1–1.0)
Monocytes Relative: 5.4 % (ref 3.0–12.0)
Neutro Abs: 4.4 10*3/uL (ref 1.4–7.7)
Neutrophils Relative %: 68.1 % (ref 43.0–77.0)
Platelets: 275 10*3/uL (ref 150.0–400.0)
RBC: 4.55 Mil/uL (ref 3.87–5.11)
RDW: 13.1 % (ref 11.5–15.5)
WBC: 6.5 10*3/uL (ref 4.0–10.5)

## 2015-09-05 LAB — COMPREHENSIVE METABOLIC PANEL
ALBUMIN: 4.4 g/dL (ref 3.5–5.2)
ALK PHOS: 45 U/L (ref 39–117)
ALT: 12 U/L (ref 0–35)
AST: 14 U/L (ref 0–37)
BUN: 17 mg/dL (ref 6–23)
CO2: 30 mEq/L (ref 19–32)
CREATININE: 0.83 mg/dL (ref 0.40–1.20)
Calcium: 9 mg/dL (ref 8.4–10.5)
Chloride: 104 mEq/L (ref 96–112)
GFR: 78.53 mL/min (ref >60.00)
GLUCOSE: 94 mg/dL (ref 70–99)
POTASSIUM: 4.6 meq/L (ref 3.5–5.1)
SODIUM: 138 meq/L (ref 135–145)
TOTAL PROTEIN: 6.9 g/dL (ref 6.0–8.3)
Total Bilirubin: 0.4 mg/dL (ref 0.2–1.2)

## 2015-09-05 LAB — LIPID PANEL
CHOL/HDL RATIO: 5
Cholesterol: 263 mg/dL — ABNORMAL HIGH (ref 0–200)
HDL: 54.2 mg/dL (ref >39.00)
LDL Cholesterol: 181 mg/dL — ABNORMAL HIGH (ref 0–99)
NONHDL: 208.76
TRIGLYCERIDES: 137 mg/dL (ref 0.0–149.0)
VLDL: 27.4 mg/dL (ref 0.0–40.0)

## 2015-09-05 LAB — HEMOGLOBIN A1C: Hgb A1c MFr Bld: 5.6 % (ref 4.6–6.5)

## 2015-09-05 LAB — TSH: TSH: 1.45 u[IU]/mL (ref 0.35–4.50)

## 2015-09-05 MED ORDER — TRAZODONE HCL 100 MG PO TABS: 200.0000 mg | ORAL_TABLET | Freq: Every day | ORAL | 1 refills | Status: DC

## 2015-09-05 MED ORDER — FLUOXETINE HCL 20 MG PO CAPS: 20.0000 mg | ORAL_CAPSULE | Freq: Every day | ORAL | 1 refills | Status: DC

## 2015-09-05 MED ORDER — VENLAFAXINE HCL ER 75 MG PO CP24: ORAL_CAPSULE | ORAL | 1 refills | Status: DC

## 2015-09-05 NOTE — Progress Notes (Signed)
Patient ID: Carmen Robinson, female  DOB: 07-08-1970, 45 y.o.   MRN: [id number] Patient Care Team    Relationship Specialty Notifications Start End  Ma Hillock, DO PCP - General Family Medicine  09/15/14     Subjective:  Carmen Robinson is a 45 y.o.  Female  present for CPE with pap. All past medical history, surgical history, allergies, family history, immunizations, medications and social history were updated in the electronic medical record today. All recent labs, ED visits and hospitalizations within the last year were reviewed.  Depression with anxiety: Pt reports compliance with prozac and effexor and trazadone. She denies any intolerable side effects. She does not feel she needs refills at this time, but will have her pharmacy call in if needed.   Health maintenance:  Colonoscopy: Never. No FHX. Screen at 49 Mammogram: completed:2016, through "[employer]" at work. She has one scheduled end of September. No fhx. Performs SBE. Requested results from last year and any future  mammogram outisde of system.  Cervical cancer screening: last pap: 2016, Dr. Edwyna Ready normal.  Immunizations: tdap 2012, Influenza 2016 (encouraged yearly)--> administered today Infectious disease screening: HIV screening today.  DEXA: never Assistive device: none  Oxygen use: none Patient has a Dental home. Hospitalizations/ED visits: none--> surgery scheduled Sept. 7 for cervical spine.   Immunization History  Administered Date(s) Administered  . Influenza,inj,Quad PF,36+ Mos 10/13/2014    Past Medical History:  Diagnosis Date  . Anxiety   . Hyperlipidemia   . Kidney stones   . Mood disorder ([place])   . Neck pain    C4-C5   Allergies  Allergen Reactions  . Iodinated Diagnostic Agents Swelling  . Penicillins   . Shellfish Allergy    Past Surgical History:  Procedure Laterality Date  . ABLATION     uterine  . CESAREAN SECTION    . ENDOMETRIAL ABLATION     Family History  Problem  Relation Age of Onset  . Hyperlipidemia Mother   . Fibromyalgia Mother   . Irritable bowel syndrome Mother   . Arthritis Mother   . Diabetes Father   . Asthma Son   . Heart disease Maternal Aunt   . Aneurysm Maternal Aunt   . AAA (abdominal aortic aneurysm) Maternal Uncle   . Stroke Maternal Grandfather   . Heart disease Paternal Grandfather   . Bipolar disorder Brother   . Diabetes Brother   . ADD / ADHD Son    Social History   Social History  . Marital status: Married    Spouse name: N/A  . Number of children: 2  . 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 Smoking status: Never Smoker  . Smokeless tobacco: Never Used  . Alcohol use 0.6 oz/week    1 Glasses of wine per week  . Drug use: No  . Sexual activity: Yes    Birth control/ protection: Surgical   Other Topics Concern  . Not on file   Social History Narrative   Lives with husband & 2 kids. Works [employer].     Medication List       Accurate as of 09/05/15  8:41 AM. Always use your most recent med list.          FLUoxetine 20 MG capsule Commonly known as:  PROZAC Take 1 capsule (20 mg total) by mouth daily.   HYDROcodone-acetaminophen  5-325 MG tablet Commonly known as:  NORCO/VICODIN   traZODone 100 MG tablet Commonly known as:  DESYREL Take 2 tablets (200 mg total) by mouth at bedtime.   venlafaxine XR 75 MG 24 hr capsule Commonly known as:  EFFEXOR-XR TAKE 1 CAPSULE DAILY WITH BREAKFAST       No results found for this or any previous visit (from the past 2160 hour(s)).  Mr Cervical Spine Wo Contrast  Result Date: 06/04/2015 CLINICAL DATA:  45 year old female with chronic neck pain worsening since November 2016 when fell. Bilateral arm pain. Initial encounter. EXAM: MRI CERVICAL SPINE WITHOUT CONTRAST TECHNIQUE: Multiplanar, multisequence MR imaging of the cervical spine was performed. No intravenous contrast was administered. COMPARISON:   None. FINDINGS: Alignment: Gentle kyphosis cervical spine centered C5 level. Vertebrae: Edema surrounding the C5-6 disc space consistent with discogenic changes. Cord: No abnormal signal.  Normal caliber. Posterior Fossa, vertebral arteries, paraspinal tissues: Within normal limits. Disc levels: C2-3:  Negative. C3-4: Shallow disc osteophyte complex slightly greater to the right. Narrowing ventral thecal sac without cord contact. Uncinate hypertrophy with mild left-sided and mild-to-moderate right-sided foraminal narrowing. C4-5: Broad-based disc osteophyte complex greater to left. Narrowing ventral thecal sac greater on left with minimal left-sided cord flattening. Uncinate hypertrophy with mild to moderate bilateral foraminal narrowing. Vertebral arteries extend slightly into the neural foramen. C5-6: Moderate sized broad-based disc osteophyte complex greater to left. Narrowing ventral thecal sac. Mild cord flattening greater on the left. Uncinate hypertrophy with moderate to marked left-sided and moderate right-sided foraminal narrowing. C6-7:  Minimal bulge.  Minimal narrowing central ventral thecal sac. C7-T1: Negative. IMPRESSION: C5-6 moderate sized broad-based disc osteophyte complex greater to left. Narrowing ventral thecal sac. Mild cord flattening greater on the left. Moderate to marked left-sided and moderate right-sided foraminal narrowing. C4-5 broad-based disc osteophyte complex greater to left. Narrowing ventral thecal sac greater on left with minimal left-sided cord flattening. Mild to moderate bilateral foraminal narrowing. Vertebral arteries extend slightly into the neural foramen. C3-4 shallow disc osteophyte complex slightly greater to the right. Narrowing ventral thecal sac without cord contact. Mild left-sided and mild-to-moderate right-sided foraminal narrowing. C6-7 minimal bulge. Electronically Signed   By: Genia Del M.D.   On: 06/04/2015 09:00     ROS: 14 pt review of systems  performed and negative (unless mentioned in an HPI)  Objective: BP 116/77 (BP Location: Left Arm, Patient Position: Sitting, Cuff Size: Large)   Pulse 71   Temp 98 F (36.7 C)   Resp 20   Ht 5\' 4"  (1.626 m)   Wt 204 lb 8 oz (92.8 kg)   SpO2 95%   BMI 35.10 kg/m  Gen: Afebrile. No acute distress. Nontoxic in appearance, well-developed, well-nourished,  Caucasian female.  HENT: AT. [place]. Bilateral TM visualized and normal in appearance, normal external auditory canal. MMM, no oral lesions, adequate dentition. Bilateral nares within normal limits. Throat without erythema, ulcerations or exudates. no Cough on exam, no hoarseness on exam. Eyes:Pupils Equal Round Reactive to light, Extraocular movements intact,  Conjunctiva without redness, discharge or icterus. Neck/lymp/endocrine: Supple,no lymphadenopathy, no thyromegaly CV: RRR no murmur, no edema, +2/4 P posterior tibialis pulses.  Chest: CTAB, no wheeze, rhonchi or crackles. normal Respiratory effort. good Air movement. Abd: Soft. obese. NTND. BS present. no Masses palpated. No hepatosplenomegaly. No rebound tenderness or guarding. Skin: no rashes, purpura or petechiae. Warm and well-perfused. Skin intact. Neuro/Msk:  Normal gait. PERLA. EOMi. Alert. Oriented x3.  Cranial nerves II through XII intact. Muscle strength  5/5 upper/lower extremity. DTRs equal bilaterally. Psych: Normal affect, dress and demeanor. Normal speech. Normal thought content and judgment. Breasts: breasts appear normal, symmetrical, no tenderness on exam, no suspicious masses, no skin or nipple changes or axillary nodes. GYN:  External genitalia within normal limits, normal hair distribution, no lesions. Urethral meatus normal, no lesions. Vaginal mucosa pink, moist, normal rugae, no lesions. No cystocele or rectocele. cervix without lesions, no discharge. Bimanual exam revealed normal uterus.  No bladder/suprapubic fullness, masses or tenderness. No cervical motion  tenderness. No adnexal fullness. Anus and perineum within normal limits, no lesions.   Assessment/plan: Carmen Robinson is a 45 y.o. female present for CPE with PAP Encounter for preventive health examination - HiV, cbc, cmp, tsh, a1c, lipid panel. - flu shot administered.  - mammogram due, she has scheduled.  - HIV screen completed today - recommended ca/vit d Patient was encouraged to exercise greater than 150 minutes a week. Patient was encouraged to choose a diet filled with fresh fruits and vegetables, and lean meats. AVS provided to patient today for education/recommendation on gender specific health and safety maintenance. Pap smear for cervical cancer screening - PAP with HPV  Depression with anxiety: - refills on effexor, trazodone, prozac today. - f/u every 6 months as long as stab;e, sooner if needed.    - Every 6 months for chronic medical issues.  - Yearly CPE  Electronically signed by: Howard Pouch, DO [place]

## 2015-09-05 NOTE — Patient Instructions (Signed)
Flu shot provided today.  We will call you with lab results.  Vit D 600-800 and calcium about 1000 mg daily.   Health Maintenance, Female Adopting a healthy lifestyle and getting preventive care can go a long way to promote health and wellness. Talk with your health care provider about what schedule of regular examinations is right for you. This is a good chance for you to check in with your provider about disease prevention and staying healthy. In between checkups, there are plenty of things you can do on your own. Experts have done a lot of research about which lifestyle changes and preventive measures are most likely to keep you healthy. Ask your health care provider for more information. WEIGHT AND DIET  Eat a healthy diet  Be sure to include plenty of vegetables, fruits, low-fat dairy products, and lean protein.  Do not eat a lot of foods high in solid fats, added sugars, or salt.  Get regular exercise. This is one of the most important things you can do for your health.  Most adults should exercise for at least 150 minutes each week. The exercise should increase your heart rate and make you sweat (moderate-intensity exercise).  Most adults should also do strengthening exercises at least twice a week. This is in addition to the moderate-intensity exercise.  Maintain a healthy weight  Body mass index (BMI) is a measurement that can be used to identify possible weight problems. It estimates body fat based on height and weight. Your health care provider can help determine your BMI and help you achieve or maintain a healthy weight.  For females 96 years of age and older:   A BMI below 18.5 is considered underweight.  A BMI of 18.5 to 24.9 is normal.  A BMI of 25 to 29.9 is considered overweight.  A BMI of 30 and above is considered obese.  Watch levels of cholesterol and blood lipids  You should start having your blood tested for lipids and cholesterol at 46 years of age, then  have this test every 5 years.  You may need to have your cholesterol levels checked more often if:  Your lipid or cholesterol levels are high.  You are older than 46 years of age.  You are at high risk for heart disease.  CANCER SCREENING   Lung Cancer  Lung cancer screening is recommended for adults 60-76 years old who are at high risk for lung cancer because of a history of smoking.  A yearly low-dose CT scan of the lungs is recommended for people who:  Currently smoke.  Have quit within the past 15 years.  Have at least a 30-pack-year history of smoking. A pack year is smoking an average of one pack of cigarettes a day for 1 year.  Yearly screening should continue until it has been 15 years since you quit.  Yearly screening should stop if you develop a health problem that would prevent you from having lung cancer treatment.  Breast Cancer  Practice breast self-awareness. This means understanding how your breasts normally appear and feel.  It also means doing regular breast self-exams. Let your health care provider know about any changes, no matter how small.  If you are in your 20s or 30s, you should have a clinical breast exam (CBE) by a health care provider every 1-3 years as part of a regular health exam.  If you are 63 or older, have a CBE every year. Also consider having a breast X-ray (mammogram)  every year.  If you have a family history of breast cancer, talk to your health care provider about genetic screening.  If you are at high risk for breast cancer, talk to your health care provider about having an MRI and a mammogram every year.  Breast cancer gene (BRCA) assessment is recommended for women who have family members with BRCA-related cancers. BRCA-related cancers include:  Breast.  Ovarian.  Tubal.  Peritoneal cancers.  Results of the assessment will determine the need for genetic counseling and BRCA1 and BRCA2 testing. Cervical Cancer Your health  care provider may recommend that you be screened regularly for cancer of the pelvic organs (ovaries, uterus, and vagina). This screening involves a pelvic examination, including checking for microscopic changes to the surface of your cervix (Pap test). You may be encouraged to have this screening done every 3 years, beginning at age 63.  For women ages 37-65, health care providers may recommend pelvic exams and Pap testing every 3 years, or they may recommend the Pap and pelvic exam, combined with testing for human papilloma virus (HPV), every 5 years. Some types of HPV increase your risk of cervical cancer. Testing for HPV may also be done on women of any age with unclear Pap test results.  Other health care providers may not recommend any screening for nonpregnant women who are considered low risk for pelvic cancer and who do not have symptoms. Ask your health care provider if a screening pelvic exam is right for you.  If you have had past treatment for cervical cancer or a condition that could lead to cancer, you need Pap tests and screening for cancer for at least 20 years after your treatment. If Pap tests have been discontinued, your risk factors (such as having a new sexual partner) need to be reassessed to determine if screening should resume. Some women have medical problems that increase the chance of getting cervical cancer. In these cases, your health care provider may recommend more frequent screening and Pap tests. Colorectal Cancer  This type of cancer can be detected and often prevented.  Routine colorectal cancer screening usually begins at 46 years of age and continues through 46 years of age.  Your health care provider may recommend screening at an earlier age if you have risk factors for colon cancer.  Your health care provider may also recommend using home test kits to check for hidden blood in the stool.  A small camera at the end of a tube can be used to examine your colon  directly (sigmoidoscopy or colonoscopy). This is done to check for the earliest forms of colorectal cancer.  Routine screening usually begins at age 3.  Direct examination of the colon should be repeated every 5-10 years through 46 years of age. However, you may need to be screened more often if early forms of precancerous polyps or small growths are found. Skin Cancer  Check your skin from head to toe regularly.  Tell your health care provider about any new moles or changes in moles, especially if there is a change in a mole's shape or color.  Also tell your health care provider if you have a mole that is larger than the size of a pencil eraser.  Always use sunscreen. Apply sunscreen liberally and repeatedly throughout the day.  Protect yourself by wearing long sleeves, pants, a wide-brimmed hat, and sunglasses whenever you are outside. HEART DISEASE, DIABETES, AND HIGH BLOOD PRESSURE   High blood pressure causes heart disease and  increases the risk of stroke. High blood pressure is more likely to develop in:  People who have blood pressure in the high end of the normal range (130-139/85-89 mm Hg).  People who are overweight or obese.  People who are African American.  If you are 1-48 years of age, have your blood pressure checked every 3-5 years. If you are 61 years of age or older, have your blood pressure checked every year. You should have your blood pressure measured twice--once when you are at a hospital or clinic, and once when you are not at a hospital or clinic. Record the average of the two measurements. To check your blood pressure when you are not at a hospital or clinic, you can use:  An automated blood pressure machine at a pharmacy.  A home blood pressure monitor.  If you are between 63 years and 24 years old, ask your health care provider if you should take aspirin to prevent strokes.  Have regular diabetes screenings. This involves taking a blood sample to check  your fasting blood sugar level.  If you are at a normal weight and have a low risk for diabetes, have this test once every three years after 46 years of age.  If you are overweight and have a high risk for diabetes, consider being tested at a younger age or more often. PREVENTING INFECTION  Hepatitis B  If you have a higher risk for hepatitis B, you should be screened for this virus. You are considered at high risk for hepatitis B if:  You were born in a country where hepatitis B is common. Ask your health care provider which countries are considered high risk.  Your parents were born in a high-risk country, and you have not been immunized against hepatitis B (hepatitis B vaccine).  You have HIV or AIDS.  You use needles to inject street drugs.  You live with someone who has hepatitis B.  You have had sex with someone who has hepatitis B.  You get hemodialysis treatment.  You take certain medicines for conditions, including cancer, organ transplantation, and autoimmune conditions. Hepatitis C  Blood testing is recommended for:  Everyone born from [birth year] through 1966.  Anyone with known risk factors for hepatitis C. Sexually transmitted infections (STIs)  You should be screened for sexually transmitted infections (STIs) including gonorrhea and chlamydia if:  You are sexually active and are younger than 46 years of age.  You are older than 46 years of age and your health care provider tells you that you are at risk for this type of infection.  Your sexual activity has changed since you were last screened and you are at an increased risk for chlamydia or gonorrhea. Ask your health care provider if you are at risk.  If you do not have HIV, but are at risk, it may be recommended that you take a prescription medicine daily to prevent HIV infection. This is called pre-exposure prophylaxis (PrEP). You are considered at risk if:  You are sexually active and do not regularly use  condoms or know the HIV status of your partner(s).  You take drugs by injection.  You are sexually active with a partner who has HIV. Talk with your health care provider about whether you are at high risk of being infected with HIV. If you choose to begin PrEP, you should first be tested for HIV. You should then be tested every 3 months for as long as you are taking PrEP.  PREGNANCY   If you are premenopausal and you may become pregnant, ask your health care provider about preconception counseling.  If you may become pregnant, take 400 to 800 micrograms (mcg) of folic acid every day.  If you want to prevent pregnancy, talk to your health care provider about birth control (contraception). OSTEOPOROSIS AND MENOPAUSE   Osteoporosis is a disease in which the bones lose minerals and strength with aging. This can result in serious bone fractures. Your risk for osteoporosis can be identified using a bone density scan.  If you are 32 years of age or older, or if you are at risk for osteoporosis and fractures, ask your health care provider if you should be screened.  Ask your health care provider whether you should take a calcium or vitamin D supplement to lower your risk for osteoporosis.  Menopause may have certain physical symptoms and risks.  Hormone replacement therapy may reduce some of these symptoms and risks. Talk to your health care provider about whether hormone replacement therapy is right for you.  HOME CARE INSTRUCTIONS   Schedule regular health, dental, and eye exams.  Stay current with your immunizations.   Do not use any tobacco products including cigarettes, chewing tobacco, or electronic cigarettes.  If you are pregnant, do not drink alcohol.  If you are breastfeeding, limit how much and how often you drink alcohol.  Limit alcohol intake to no more than 1 drink per day for nonpregnant women. One drink equals 12 ounces of beer, 5 ounces of wine, or 1 ounces of hard  liquor.  Do not use street drugs.  Do not share needles.  Ask your health care provider for help if you need support or information about quitting drugs.  Tell your health care provider if you often feel depressed.  Tell your health care provider if you have ever been abused or do not feel safe at home.   This information is not intended to replace advice given to you by your health care provider. Make sure you discuss any questions you have with your health care provider.   Document Released: 07/09/2010 Document Revised: 01/14/2014 Document Reviewed: 11/25/2012 Elsevier Interactive Patient Education [employer].

## 2015-09-06 ENCOUNTER — Telehealth: Payer: Self-pay | Admitting: Family Medicine

## 2015-09-06 DIAGNOSIS — F329 Major depressive disorder, single episode, unspecified: Secondary | ICD-10-CM

## 2015-09-06 DIAGNOSIS — F32A Depression, unspecified: Secondary | ICD-10-CM

## 2015-09-06 LAB — HIV ANTIBODY (ROUTINE TESTING W REFLEX): HIV: NONREACTIVE

## 2015-09-06 LAB — CYTOLOGY - PAP

## 2015-09-06 NOTE — Telephone Encounter (Signed)
Spoke with patient reviewed lab results and instructions with patient. Patient verbalized understanding . 

## 2015-09-06 NOTE — Telephone Encounter (Signed)
Left message with lab results and detailed instructions on patient voice mail per DPR. Asked patient to return call for any questions regarding instructions.

## 2015-09-06 NOTE — Telephone Encounter (Addendum)
Please call pt: - all blood work looks great. With the exception of her lipid panel. Her PAP will take a few days to result, we will call her if abnormal otherwise it post to mychart in a week.  - we will either need to increase her fish oil supplement if able (after surgery)- I am not certain the dose she was taking, or consider low dose statin-cholesterol lowering medication for cardiovascular protection with her Fhx.  - Of course low saturated fat, high fiber diet and exercise is helpful. She has been unable to exercise secondary to pain and is having a surgery next week,  So hopefully that will allow her to get back to exercising as well . - please ask her the amount of fish oil she was taking, target would be 2-3g if not already doing so. If she is already taking 3g, then after healed from surgery we should talk about a statin.    Lipid Panel     Component Value Date/Time   CHOL 263 (H) 09/05/2015 0914   TRIG 137.0 09/05/2015 0914   HDL 54.20 09/05/2015 0914   CHOLHDL 5 09/05/2015 0914   VLDL 27.4 09/05/2015 0914   LDLCALC 181 (H) 09/05/2015 0914

## 2015-10-31 ENCOUNTER — Telehealth: Payer: Self-pay | Admitting: Family Medicine

## 2015-10-31 NOTE — Telephone Encounter (Signed)
Patient states she was advised by her pcp in September she need repeat labs.  Patient not sure of the labs she needs, she thinks it make be a lipid panel.  Please advise which labs patient needs and place order in system so appt can be scheduled with patient.

## 2015-10-31 NOTE — Telephone Encounter (Signed)
Spoke with patient she is not due for any lab work until end of Jan first of Feb 2018 . Instructions were for her to restart Fish oil 3 grams daily and follow up in 6 months. Patient verbalized understanding.

## 2015-12-25 ENCOUNTER — Encounter: Payer: Self-pay | Admitting: Family Medicine

## 2015-12-25 ENCOUNTER — Ambulatory Visit (INDEPENDENT_AMBULATORY_CARE_PROVIDER_SITE_OTHER): Payer: Managed Care, Other (non HMO) | Admitting: Family Medicine

## 2015-12-25 DIAGNOSIS — F332 Major depressive disorder, recurrent severe without psychotic features: Secondary | ICD-10-CM | POA: Diagnosis not present

## 2015-12-25 MED ORDER — CLONAZEPAM 0.5 MG PO TABS: 0.5000 mg | ORAL_TABLET | Freq: Two times a day (BID) | ORAL | 1 refills | Status: DC | PRN

## 2015-12-25 MED ORDER — FLUOXETINE HCL 40 MG PO CAPS: 40.0000 mg | ORAL_CAPSULE | Freq: Every day | ORAL | 1 refills | Status: DC

## 2015-12-25 NOTE — Progress Notes (Signed)
Patient ID: Carmen Robinson, female   DOB: [birth year], 45 y.o.   MRN: [id number]   Subjective:    Patient ID: Carmen Robinson, female    DOB: 09-02-1970, 45 y.o.   MRN: [id number]  HPI Patient presents for Acute office visit secondary to increase in her anxiety and insomnia.  Insomnia/anxiety:  Patient states she and her husband are having difficulties. He has been unable to sleep secondary to increase in her stress level. He has continued to take trazodone 200 mg daily at bedtime, Prilosec 20 mg daily and Effexor 75 mg daily. This regimen had been working for her prior to the new acute onset of stress. She reports her husband wants a divorce. She states the problems go back to 6 years ago when she "cheated on him ". She feels since then they have worked on their issues and have been doing good. She states he feels that they have had Down's, and he no longer wants to work on the relationship. They had an issue this past August and which he had concerns she had feelings for a coworker. She states that his the issue at all, but she admits when she has her "lows ", she then looks for attention to bring her up. She admits to being fictitious, and flirting with this coworker but states she had no intentions of cheating. She states she does not "know what's wrong" with her, and that she needs this attention. She states she has been jealous of her husband and her best friend who last few months. The 2 of them have been working out together, and her husband has now lost over 70 pounds. She states this occurred after the difficulty they had in August, and at that time she had told him that she is no longer attracted to him. They're currently still living within the same household, but states they he doesn't want to "be married to her anymore ". She is seeing a Social worker through the [employer]. She states her husband is not open to seeking any counseling. She denies SI or HI.  Depression screen [facility] 2/9  12/25/2015 09/05/2015  Decreased Interest 1 0  Down, Depressed, Hopeless 3 0  PHQ - 2 Score 4 0  Altered sleeping 3 -  Tired, decreased energy 2 -  Change in appetite 1 -  Feeling bad or failure about yourself  3 -  Trouble concentrating 0 -  Moving slowly or fidgety/restless 0 -  Suicidal thoughts 0 -  PHQ-9 Score 13 -   GAD 7 : Generalized Anxiety Score 12/25/2015  Nervous, Anxious, on Edge 3  Control/stop worrying 3  Worry too much - different things 3  Trouble relaxing 1  Restless 0  Easily annoyed or irritable 0  Afraid - awful might happen 3  Total GAD 7 Score 13  Anxiety Difficulty Somewhat difficult        Past Medical History:  Diagnosis Date  . Anxiety   . Hyperlipidemia   . Kidney stones   . Mood disorder ([place])   . Neck pain    C4-C5   Allergies  Allergen Reactions  . Iodinated Diagnostic Agents Swelling  . Penicillins   . Shellfish Allergy    Social History   Social History  . Marital status: Married    Spouse name: N/A  . Number of children: 2  . Years of education: N/A   Occupational History  .      time wrner   Social History  Main Topics  . Smoking status: Never Smoker  . Smokeless tobacco: Never Used  . Alcohol use 0.6 oz/week    1 Glasses of wine per week  . Drug use: No  . Sexual activity: Yes    Birth control/ protection: Surgical   Other Topics Concern  . Not on file   Social History Narrative   Lives with husband & 2 kids.   Works [employer].   Wears her seatbelt.   Smoke detector in the home.       Review of Systems  Psychiatric/Behavioral: The patient has insomnia.    Negative, with the exception of above mentioned in HPI     Objective:   Physical Exam BP 130/84 (BP Location: Right Arm, Patient Position: Sitting, Cuff Size: Normal)   Pulse 91   Temp 99.5 F (37.5 C)   Resp 18   Wt 191 lb 4 oz (86.8 kg)   SpO2 98%   BMI 32.83 kg/m  Gen: Afebrile. No acute distress. Nontoxic in appearance, well-developed,  well-nourished, Caucasian female. Neuro: Normal gait. PERLA. EOMi. Alert. Oriented.  Psych: Tearful, moderately anxious, talkative, sad. Normal dress. Normal speech. Normal thought content and judgment. No SI or HI.    Assessment & Plan:  Depression/ Anxiety/Insomnia - Increased Prozac to 40 mg daily. Prescription provided today. - Continue Effexor 75 mg daily, she has been unable to tolerate increased doses secondary to palpitations. - Continue trazodone 200 mg daily at bedtime. - Klonopin 0.5 mg daily at bedtime provided today to help with stress induced insomnia. - Patient is to continue therapy with her current counselor, which is [facility] the [facility]. - Patient does not feel her husband will be open to having marital counseling, I have advised her many resources at [facility] [place] if they changed her mind. -Patient was encouraged to report to the emergency room if needing emergent psychiatric care, [facility] room with encouraged secondary to their next with behavior health. She was also provided with resources for 24-hour crisis line. - Follow-up in 4 weeks   > 25 minutes spent with patient, >50% of time spent face to face counseling patient   Electronically Signed by: Howard Pouch, DO [facility]

## 2015-12-25 NOTE — Patient Instructions (Signed)
Increase Prozac 40 mg daily (take 2 of the 20 mg you have currently). The new script will be 40 mg each pill (1 pill then).  You can use the klonopin at night to help with sleep. I do not want you to take this forever, but until the increase prozac to kick in and provide more coverage.    Continue with therapist and if needed try [facility] for 24 crisis line or therapy.

## 2016-01-22 ENCOUNTER — Ambulatory Visit: Payer: Managed Care, Other (non HMO) | Admitting: Family Medicine

## 2016-02-06 ENCOUNTER — Encounter: Payer: Self-pay | Admitting: Family Medicine

## 2016-02-06 ENCOUNTER — Ambulatory Visit (INDEPENDENT_AMBULATORY_CARE_PROVIDER_SITE_OTHER): Payer: Managed Care, Other (non HMO) | Admitting: Family Medicine

## 2016-02-06 DIAGNOSIS — F332 Major depressive disorder, recurrent severe without psychotic features: Secondary | ICD-10-CM

## 2016-02-06 MED ORDER — TRAZODONE HCL 100 MG PO TABS: 200.0000 mg | ORAL_TABLET | Freq: Every day | ORAL | 1 refills | Status: DC

## 2016-02-06 MED ORDER — VENLAFAXINE HCL ER 75 MG PO CP24: ORAL_CAPSULE | ORAL | 1 refills | Status: DC

## 2016-02-06 MED ORDER — CLONAZEPAM 0.5 MG PO TABS
0.5000 mg | ORAL_TABLET | Freq: Two times a day (BID) | ORAL | 1 refills | Status: DC | PRN
Start: 2016-02-06 — End: 2016-11-11

## 2016-02-06 MED ORDER — FLUOXETINE HCL 40 MG PO CAPS: 40.0000 mg | ORAL_CAPSULE | Freq: Two times a day (BID) | ORAL | 1 refills | Status: DC

## 2016-02-06 NOTE — Patient Instructions (Signed)
Increase prozac to every 12 hours. You can complete the medication you have at home by taking every 12 hours.  I have increased klonopin to up to 2x a day.     Follow up in 3 months as long as doing well on medication.  Continue to follow with therapist.

## 2016-02-06 NOTE — Progress Notes (Signed)
Patient ID: MALAIKA MELLEM, female   DOB: 01-03-1971, 46 y.o.   MRN: [id number]   Subjective:    Patient ID: [name], female    DOB: [birth year], 46 y.o.   MRN: [id number]  Anxiety  Symptoms include insomnia.    Insomnia  PMH includes: depression.  Depression         Associated symptoms include insomnia.  Past medical history includes anxiety.    Patient presents for follow up  office visit for her anxiety and insomnia.  Insomnia/anxiety/depression:   Pt presents for follow today after prozac was increased last visit to 40 mg QD. Pt states her circumstances are about the same as when she was here 4 weeks ago. She is still depressed, but she has noticed the improvement with the increased dose of medication. She has continued the trazodone 200 mg and effexor 75 mg. She has been using the klonopin 0.5 mg daily to not have a "panic attack". She has continued to see her counselor. She states her husband still will not trust her. Since last appt he has lost his job. When she tries to talk to him, he states he can not focus on anything besides trying to open his business. He is not open to hear her feelings and is focused on his situation, his feelings and continues to shut her out. He continues to work out and go to store etc with his female friend (which was her friend). He refuses to give up the friendship with the female friend or listen to how it makes Halia feel. Krystine states she is just trying to remain "nice" because she feels that all she can do, and she does not want her two children to suffer and have their parents divorced. She feels he is controlling the relationship by not talking to her or being open to fix their relationship, but he is still present.   Prior note:  Patient states she and her husband are having difficulties. SHe has been unable to sleep secondary to increase in her stress level. SHe has continued to take trazodone 200 mg daily at bedtime, Prozac 20 mg daily and  Effexor 75 mg daily. This regimen had been working for her prior to the new acute onset of stress. She reports her husband wants a divorce. She states the problems go back to 6 years ago when she "cheated on him ". She feels since then they have worked on their issues and have been doing good. She states he feels that they have had Downs, and he no longer wants to work on the relationship. They had an issue this past August and which he had concerns she had feelings for a coworker. She states that was not the issue at all, but she admits when she has her "lows ", she then looks for attention to bring her up. She admits to being fictitious, and flirting with this coworker but states she had no intentions of cheating. She states she does not "know what's wrong" with her, and that she needs this attention. She states she has been jealous of her husband and her best friend who last few months. The 2 of them have been working out together, and her husband has now lost over 70 pounds. She states this occurred after the difficulty they had in August, and at that time she had told him that she is no longer attracted to him. They're currently still living within the same household, but states they he doesn't  want to "be married to her anymore ". She is seeing a Social worker through the [employer]. She states her husband is not open to seeking any counseling. She denies SI or HI.  Depression screen [facility] 2/9 12/25/2015 09/05/2015  Decreased Interest 1 0  Down, Depressed, Hopeless 3 0  PHQ - 2 Score 4 0  Altered sleeping 3 -  Tired, decreased energy 2 -  Change in appetite 1 -  Feeling bad or failure about yourself  3 -  Trouble concentrating 0 -  Moving slowly or fidgety/restless 0 -  Suicidal thoughts 0 -  PHQ-9 Score 13 -   GAD 7 : Generalized Anxiety Score 12/25/2015  Nervous, Anxious, on Edge 3  Control/stop worrying 3  Worry too much - different things 3  Trouble relaxing 1  Restless 0  Easily  annoyed or irritable 0  Afraid - awful might happen 3  Total GAD 7 Score 13  Anxiety Difficulty Somewhat difficult     Past Medical History:  Diagnosis Date  . Anxiety   . Hyperlipidemia   . Kidney stones   . Mood disorder ([place])   . Neck pain    C4-C5   Allergies  Allergen Reactions  . Iodinated Diagnostic Agents Swelling  . Penicillins   . Shellfish Allergy    Social History   Social History  . Marital status: Married    Spouse name: N/A  . Number of children: 2  . 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 Smoking status: Never Smoker  . Smokeless tobacco: Never Used  . Alcohol use 0.6 oz/week    1 Glasses of wine per week  . Drug use: No  . Sexual activity: Yes    Birth control/ protection: Surgical   Other Topics Concern  . Not on file   Social History Narrative   Lives with husband & 2 kids.   Works [employer].   Wears her seatbelt.   Smoke detector in the home.       Review of Systems  Psychiatric/Behavioral: Positive for depression. The patient has insomnia.    Negative, with the exception of above mentioned in HPI     Objective:   Physical Exam BP 128/80 (BP Location: Left Arm, Patient Position: Sitting, Cuff Size: Normal)   Pulse 78   Temp 98.1 F (36.7 C)   Resp 20   Wt 181 lb 12 oz (82.4 kg)   SpO2 99%   BMI 31.20 kg/m  Gen: Afebrile. No acute distress. Pleasant caucasian female.  Psych: Normal affect, dress and demeanor. Normal speech. Normal thought content and judgment.     Assessment & Plan:  ALYVIAH MARTZ is a 46 y.o. female present for follow up on depression and anxiety Depression/ Anxiety/Insomnia - Increased Prozac to 40 mg BID. Prescription provided today. - Continue Effexor 75 mg daily, she has been unable to tolerate increased doses secondary to palpitations. - Continue trazodone 200 mg daily at bedtime. - Klonopin 0.5 mg BID PRN, especially at bedtime.  - Patient  is to continue therapy with her current counselor, which is through the [facility]. - F/U 3 months  > 25 minutes spent with patient, >50% of time spent face to face counseling patient   Electronically Signed by: Howard Pouch, DO  primary [place]

## 2016-02-19 ENCOUNTER — Ambulatory Visit: Payer: Managed Care, Other (non HMO) | Admitting: Family Medicine

## 2016-02-19 ENCOUNTER — Telehealth: Payer: Self-pay | Admitting: Family Medicine

## 2016-02-19 NOTE — Telephone Encounter (Signed)
Spoke with patient reviewed information. She will get new set of paperwork and schedule an appt for evaluation.

## 2016-02-19 NOTE — Telephone Encounter (Signed)
Please call pt: - I received ADA paperwork for her on Friday that she desires I fill out concerning her employment.  - There is a section she needs to complete, which she did not, and I would need that completed prior to considering circumstances. In addition, this type of paperwork completion needs an appt to discuss, to make appropriate goals and recommendations.  - I will also need a new copy of the physician portion, she accidentally wrote on that section and filled in boxes intended for the physician.

## 2016-03-27 ENCOUNTER — Encounter: Payer: Self-pay | Admitting: Family Medicine

## 2016-03-27 ENCOUNTER — Ambulatory Visit (INDEPENDENT_AMBULATORY_CARE_PROVIDER_SITE_OTHER): Payer: Managed Care, Other (non HMO) | Admitting: Family Medicine

## 2016-03-27 VITALS — BP 120/79 | HR 59 | Temp 98.0°F | Resp 20 | Ht 64.0 in | Wt 168.2 lb

## 2016-03-27 DIAGNOSIS — F419 Anxiety disorder, unspecified: Secondary | ICD-10-CM | POA: Diagnosis not present

## 2016-03-27 DIAGNOSIS — F331 Major depressive disorder, recurrent, moderate: Secondary | ICD-10-CM | POA: Diagnosis not present

## 2016-03-27 DIAGNOSIS — Z635 Disruption of family by separation and divorce: Secondary | ICD-10-CM | POA: Diagnosis not present

## 2016-03-27 NOTE — Progress Notes (Signed)
Carmen Robinson , 05/11/1970, 46 y.o., female MRN: [id number] Patient Care Team    Relationship Specialty Notifications Start End  Ma Hillock, DO PCP - General Family Medicine  09/15/14   Brien Few, MD Consulting Physician Obstetrics and Gynecology  09/05/15     CC: anxiety/panic/depressoin Subjective:  Insomnia/anxiety/depression:  Pt comes in today to discuss her anxiety and panic attacks. She feels she was doing "o.k" with her depression and anxiety, until she and her husband started to have problems. They has seen a counselor, and she has seen a therapist Mickel Crow). They have now decided to become legally separated and he has moved out within the last week. She has papers with her today "ADA" forms to have completed. She states since the turmoil in her marriage she is sometimes unable to perform her job. She gets too panicked, and can not focus or communicate well with other. 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 She reports she is needing to be excused from work more frequently. Her employer told her she should have ADA papers completed to help cover her needs.  She has declined referral to psychiatry in the past.   Prior note:  Pt presents for follow today after prozac was increased last visit to 40 mg QD. Pt states her circumstances are about the same as when she was here 4 weeks ago. She is still depressed, but she has noticed the improvement with the increased dose of medication. She has continued the trazodone 200 mg and effexor 75 mg. She has been using the klonopin 0.5 mg daily to not have a "panic attack". She has continued to see her counselor. She states her husband still will not trust her. Since last appt he has lost his job. When she tries to talk to him, he states he can not focus on anything besides trying to open his business. He is not open to hear her feelings and is focused on his situation, his feelings and continues to shut  her out. He continues to work out and go to store etc with his female friend (which was her friend). He refuses to give up the friendship with the female friend or listen to how it makes Carmen Robinson feel. [name] states she is just trying to remain "nice" because she feels that all she can do, and she does not want her two children to suffer and have their parents divorced. She feels he is controlling the relationship by not talking to her or being open to fix their relationship, but he is still present.   Prior note:  Patient states she and her husband are having difficulties. SHe has been unable to sleep secondary to increase in her stress level. SHe has continued to take trazodone 200 mg daily at bedtime, Prozac 20 mg daily and Effexor 75 mg daily. This regimen had been working for her prior to the new acute onset of stress. She reports her husband wants a divorce. She states the problems go back to 6 years ago when she "cheated on him ". She feels since then they have worked on their issues and have been doing good. She states he feels that they have had Downs, and he no longer wants to work on the relationship. They had an issue this past August and which he had concerns she had feelings for a coworker. She states that was not the issue at all, but she admits when she  has her "lows ", she then looks for attention to bring her up. She admits to being fictitious, and flirting with this coworker but states she had no intentions of cheating. She states she does not "know what's wrong" with her, and that she needs this attention. She states she has been jealous of her husband and her best friend who last few months. The 2 of them have been working out together, and her husband has now lost over 70 pounds. She states this occurred after the difficulty they had in August, and at that time she had told him that she is no longer attracted to him. They're currently still living within the same household, but states they he  doesn't want to "be married to her anymore ". She is seeing a Social worker through the [employer]. She states her husband is not open to seeking any counseling. She denies SI or HI.   Depression screen [facility] 2/9 12/25/2015 09/05/2015  Decreased Interest 1 0  Down, Depressed, Hopeless 3 0  PHQ - 2 Score 4 0  Altered sleeping 3 -  Tired, decreased energy 2 -  Change in appetite 1 -  Feeling bad or failure about yourself  3 -  Trouble concentrating 0 -  Moving slowly or fidgety/restless 0 -  Suicidal thoughts 0 -  PHQ-9 Score 13 -    Allergies  Allergen Reactions  . Iodinated Diagnostic Agents Swelling  . Penicillins   . Shellfish Allergy    Social History  Substance Use Topics  . Smoking status: Never Smoker  . Smokeless tobacco: Never Used  . Alcohol use 0.6 oz/week    1 Glasses of wine per week   Past Medical History:  Diagnosis Date  . Anxiety   . Hyperlipidemia   . Kidney stones   . Mood disorder ([place])   . Neck pain    C4-C5   Past Surgical History:  Procedure Laterality Date  . ABLATION     uterine  . CESAREAN SECTION    . ENDOMETRIAL ABLATION     Family History  Problem Relation Age of Onset  . Hyperlipidemia Mother   . Fibromyalgia Mother   . Irritable bowel syndrome Mother   . Arthritis Mother   . Diabetes Father   . Asthma Son   . Heart disease Maternal Aunt   . Aneurysm Maternal Aunt   . AAA (abdominal aortic aneurysm) Maternal Uncle   . Stroke Maternal Grandfather   . Heart disease Paternal Grandfather   . Bipolar disorder Brother   . Diabetes Brother   . ADD / ADHD Son    Allergies as of 03/27/2016      Reactions   Iodinated Diagnostic Agents Swelling   Penicillins    Shellfish Allergy       Medication List       Accurate as of 03/27/16  3:46 PM. Always use your most recent med list.          clonazePAM 0.5 MG tablet Commonly known as:  KLONOPIN Take 1 tablet (0.5 mg total) by mouth 3 times/day as needed-between meals &  bedtime for anxiety.   co-enzyme Q-10 30 MG capsule Take 30 mg by mouth 3 (three) times daily.   cyclobenzaprine 10 MG tablet Commonly known as:  FLEXERIL   Fish Oil 1000 MG Cpdr Take by mouth.   FLUoxetine 40 MG capsule Commonly known as:  PROZAC Take 1 capsule (40 mg total) by mouth 2 (two) times daily.   fluticasone  50 MCG/ACT nasal spray Commonly known as:  FLONASE   RED YEAST RICE PO Take by mouth.   traZODone 100 MG tablet Commonly known as:  DESYREL Take 2 tablets (200 mg total) by mouth at bedtime.   venlafaxine XR 75 MG 24 hr capsule Commonly known as:  EFFEXOR-XR TAKE 1 CAPSULE DAILY WITH BREAKFAST       No results found for this or any previous visit (from the past 24 hour(s)). No results found.   ROS: Negative, with the exception of above mentioned in HPI   Objective:  BP 120/79 (BP Location: Right Arm, Patient Position: Sitting, Cuff Size: Normal)   Pulse (!) 59   Temp 98 F (36.7 C)   Resp 20   Ht 5\' 4"  (1.626 m)   Wt 168 lb 4 oz (76.3 kg)   SpO2 97%   BMI 28.88 kg/m  Body mass index is 28.88 kg/m. Gen: Afebrile. No acute distress. Nontoxic in appearance, well developed, well nourished.  HENT: AT. [place].  MMM Eyes:Pupils Equal Round Reactive to light, Extraocular movements intact,  Conjunctiva without redness, discharge or icterus. Psych: moderately anxious,  Talkative. Normal dress. Normal speech. Normal thought content and judgment.  Assessment/Plan: PRICILLA MOEHLE is a 46 y.o. female present for OV to discuss ADA form and anxiety/depression. Anxiety Moderate episode of recurrent major depressive disorder (HCC) Marital maladjustment involving estrangement Discussed with pt in depth today, her medication management, therapy and request  For ADA forms.  - It is understood she feels this is only because of current issues with her husband. However, if she feels she is having uncontrolled anxiety to the point of needing ADA papers, then she  needs to be evaluated and treated by psychiatry.  - I am willing to help her out by completeling a 2 month temporary ADA form, of which she has been advised does not mean her employer necessarily has to approve for her current situation.  - I have advised her to have her therapist also fill out form.  - If she needs any additional ADA forms or extension for this condition, she will need to be evaluated by psych. Pt is in agreement. She states she feels better since the increase in prozac.  - pt is to continue her regular routine follow ups for this condition.  - she will be called when form is ready and she can pick up or we can fax for her.   Reviewed expectations re: course of current medical issues.  Discussed self-management of symptoms.  Outlined signs and symptoms indicating need for more acute intervention.  Patient verbalized understanding and all questions were answered.  Patient received an After-Visit Summary.   > 25 minutes spent with patient, >50% of time spent face to face counseling and/or coordinating care.    electronically signed by:  Howard Pouch, DO  [place]

## 2016-03-27 NOTE — Patient Instructions (Signed)
I am willing to write this request up for short term. This does not mean they have to take my advise.  I would recommend you ask the therapist to also write one up for you.  If you need additional ADA form, would refer you to Psychiatry to complete.

## 2016-04-10 ENCOUNTER — Telehealth: Payer: Self-pay | Admitting: Family Medicine

## 2016-04-10 NOTE — Telephone Encounter (Signed)
Patient needs her paperwork to be amended. She will need the date changed.

## 2016-04-11 NOTE — Telephone Encounter (Signed)
Spoke with patient she states her HR dept states paperwork needs to list dates she has been out for this condition which she says is 03/23/16 and 04/03/16 also she states it has to list frequency and duration that she might be out for this condition. She would like [place] to add this as an amendment to her previously completed paperwork. Please advise.

## 2016-04-11 NOTE — Telephone Encounter (Signed)
Her paperwork did not ask for those specifics, and that sounds more like FMLA than the paperwork she brought in to be completed (ADA). Those are two different types of paperwork. Please double check the paperwork I filled out (we have copy) and make sure there was not an area asking for that specific information that I could have missed.   I am not able to predict the frequency of her absences, as this is not a "scheduled" condition and  I already have the dates of the covered duration on the paperwork.   The specific dates she stated needed added fall within the time parameter I wrote the papers to include. If they need [place] to add those specific dates, that is ok.  I can not predict frequency and if she is requiring FMLA, which is what it sounds like they are starting to ask for, then she would need to be referred to psychiatry for her condition.

## 2016-04-12 NOTE — Telephone Encounter (Signed)
Tried to call patient left message for return call.

## 2016-04-16 NOTE — Telephone Encounter (Signed)
Mailbox full unable to leave a message.

## 2016-04-16 NOTE — Telephone Encounter (Signed)
Tried to contact patient voice mail full unable to leave message 

## 2016-04-17 NOTE — Telephone Encounter (Signed)
Patient returned call she states she needs [place] to just add the 2 dates to previous paperwork that she was absent from work she states she doesn't qualify for FMLA since she used to up for another issue. Patient states she does not want to see a psychiatrist she feels like medication she is on is working for her. Paperwork amended and re-faxed to patients HR dept.

## 2016-05-02 ENCOUNTER — Other Ambulatory Visit: Payer: Self-pay | Admitting: *Deleted

## 2016-05-02 MED ORDER — TRAZODONE HCL 100 MG PO TABS: 200.0000 mg | ORAL_TABLET | Freq: Every day | ORAL | 0 refills | Status: DC

## 2016-07-11 ENCOUNTER — Telehealth: Payer: Self-pay | Admitting: *Deleted

## 2016-07-11 NOTE — Telephone Encounter (Signed)
patient called requesting refill on trazodone last refilled on 05/02/16 for 180 tabs no refills patient last seen 03/27/16 .

## 2016-07-12 MED ORDER — TRAZODONE HCL 100 MG PO TABS: 200.0000 mg | ORAL_TABLET | Freq: Every day | ORAL | 0 refills | Status: DC

## 2016-07-12 NOTE — Telephone Encounter (Signed)
We can refill once (completed). She will need follow up before next refill if she does not already have one scheduled.

## 2016-07-15 MED ORDER — TRAZODONE HCL 100 MG PO TABS: 200.0000 mg | ORAL_TABLET | Freq: Every day | ORAL | 0 refills | Status: DC

## 2016-07-15 NOTE — Telephone Encounter (Signed)
Rx for 7 day supply of trazodone sent to pharmacy .

## 2016-07-15 NOTE — Telephone Encounter (Signed)
Can send a 14 day supple to local pharmacy.

## 2016-07-15 NOTE — Addendum Note (Signed)
Addended by: Leota Jacobsen on: 07/15/2016 01:46 PM   Modules accepted: Orders

## 2016-07-15 NOTE — Telephone Encounter (Signed)
Pt states she is completely out of medication.  She wants to know if rx can also be sent to [place] so she can take medication tonight until her mail order shipment comes in.

## 2016-10-18 ENCOUNTER — Other Ambulatory Visit: Payer: Self-pay | Admitting: *Deleted

## 2016-10-18 MED ORDER — VENLAFAXINE HCL ER 75 MG PO CP24: ORAL_CAPSULE | ORAL | 0 refills | Status: DC

## 2016-10-18 NOTE — Telephone Encounter (Signed)
effexor refilled for 14 day supply .scheduled patient to be seen next week.

## 2016-10-22 ENCOUNTER — Encounter: Payer: Self-pay | Admitting: Family Medicine

## 2016-10-22 ENCOUNTER — Ambulatory Visit: Payer: Managed Care, Other (non HMO) | Admitting: Family Medicine

## 2016-10-22 DIAGNOSIS — Z0289 Encounter for other administrative examinations: Secondary | ICD-10-CM

## 2016-10-25 ENCOUNTER — Ambulatory Visit (INDEPENDENT_AMBULATORY_CARE_PROVIDER_SITE_OTHER): Payer: Managed Care, Other (non HMO) | Admitting: Family Medicine

## 2016-10-25 ENCOUNTER — Encounter: Payer: Self-pay | Admitting: Family Medicine

## 2016-10-25 VITALS — BP 117/73 | HR 68 | Temp 97.9°F | Resp 20 | Wt 197.5 lb

## 2016-10-25 DIAGNOSIS — J320 Chronic maxillary sinusitis: Secondary | ICD-10-CM | POA: Diagnosis not present

## 2016-10-25 MED ORDER — DOXYCYCLINE HYCLATE 100 MG PO TABS: 100.0000 mg | ORAL_TABLET | Freq: Two times a day (BID) | ORAL | 0 refills | Status: DC

## 2016-10-25 NOTE — Patient Instructions (Signed)
Rest, hydrate.  + flonase, start Claritin or zyrtec, nettie pot or nasal saline.  Doxycyline every 12 hours  prescribed, take until completed.  If cough present it can last up to 6-8 weeks.  F/U 2 weeks of not improved.     Sinusitis, Adult Sinusitis is soreness and inflammation of your sinuses. Sinuses are hollow spaces in the bones around your face. They are located:  Around your eyes.  In the middle of your forehead.  Behind your nose.  In your cheekbones.  Your sinuses and nasal passages are lined with a stringy fluid (mucus). Mucus normally drains out of your sinuses. When your nasal tissues get inflamed or swollen, the mucus can get trapped or blocked so air cannot flow through your sinuses. This lets bacteria, viruses, and funguses grow, and that leads to infection. Follow these instructions at home: Medicines  Take, use, or apply over-the-counter and prescription medicines only as told by your doctor. These may include nasal sprays.  If you were prescribed an antibiotic medicine, take it as told by your doctor. Do not stop taking the antibiotic even if you start to feel better. Hydrate and Humidify  Drink enough water to keep your pee (urine) clear or pale yellow.  Use a cool mist humidifier to keep the humidity level in your home above 50%.  Breathe in steam for 10-15 minutes, 3-4 times a day or as told by your doctor. You can do this in the bathroom while a hot shower is running.  Try not to spend time in cool or dry air. Rest  Rest as much as possible.  Sleep with your head raised (elevated).  Make sure to get enough sleep each night. General instructions  Put a warm, moist washcloth on your face 3-4 times a day or as told by your doctor. This will help with discomfort.  Wash your hands often with soap and water. If there is no soap and water, use hand sanitizer.  Do not smoke. Avoid being around people who are smoking (secondhand smoke).  Keep all  follow-up visits as told by your doctor. This is important. Contact a doctor if:  You have a fever.  Your symptoms get worse.  Your symptoms do not get better within 10 days. Get help right away if:  You have a very bad headache.  You cannot stop throwing up (vomiting).  You have pain or swelling around your face or eyes.  You have trouble seeing.  You feel confused.  Your neck is stiff.  You have trouble breathing. This information is not intended to replace advice given to you by your health care provider. Make sure you discuss any questions you have with your health care provider. Document Released: 06/12/2007 Document Revised: 08/20/2015 Document Reviewed: 10/19/2014 Elsevier Interactive Patient Education  [employer].

## 2016-10-25 NOTE — Progress Notes (Signed)
Carmen Robinson , Sep 04, 1970, 46 y.o., female MRN: [id number] Patient Care Team    Relationship Specialty Notifications Start End  Ma Hillock, DO PCP - General Family Medicine  09/15/14   Brien Few, MD Consulting Physician Obstetrics and Gynecology  09/05/15     Chief Complaint  Patient presents with  . Sinusitis    facial pain,headache     Subjective: Pt presents for an OV with complaints of facial pain and headache of 3 days duration.  Associated symptoms include Frontal sinus pressure, sore throat, nasal dryness. She reports she has taken ibuprofen but the pressure behind her eyes getting worse. She states she gets like this a few times a year and typically needs an antibiotic. She denies fever, chills, nausea or vomiting.  Depression screen [facility] [address] 2/9 12/25/2015 09/05/2015  Decreased Interest 1 0  Down, Depressed, Hopeless 3 0  PHQ - 2 Score 4 0  Altered sleeping 3 -  Tired, decreased energy 2 -  Change in appetite 1 -  Feeling bad or failure about yourself  3 -  Trouble concentrating 0 -  Moving slowly or fidgety/restless 0 -  Suicidal thoughts 0 -  PHQ-9 Score 13 -    Allergies  Allergen Reactions  . Iodinated Diagnostic Agents Swelling  . Penicillins   . Shellfish Allergy    Social History  Substance Use Topics  . Smoking status: Never Smoker  . Smokeless tobacco: Never Used  . Alcohol use 0.6 oz/week    1 Glasses of wine per week   Past Medical History:  Diagnosis Date  . Anxiety   . Hyperlipidemia   . Kidney stones   . Mood disorder ([place])   . Neck pain    C4-C5   Past Surgical History:  Procedure Laterality Date  . ABLATION     uterine  . CESAREAN SECTION    . ENDOMETRIAL ABLATION     Family History  Problem Relation Age of Onset  . Hyperlipidemia Mother   . Fibromyalgia Mother   . Irritable bowel syndrome Mother   . Arthritis Mother   . Diabetes Father   . Asthma Son   . Heart disease Maternal Aunt   . Aneurysm Maternal Aunt   .  AAA (abdominal aortic aneurysm) Maternal Uncle   . Stroke Maternal Grandfather   . Heart disease Paternal Grandfather   . Bipolar disorder Brother   . Diabetes Brother   . ADD / ADHD Son    Allergies as of 10/25/2016      Reactions   Iodinated Diagnostic Agents Swelling   Penicillins    Shellfish Allergy       Medication List       Accurate as of 10/25/16  4:01 PM. Always use your most recent med list.          clonazePAM 0.5 MG tablet Commonly known as:  KLONOPIN Take 1 tablet (0.5 mg total) by mouth 3 times/day as needed-between meals & bedtime for anxiety.   co-enzyme Q-10 30 MG capsule Take 30 mg by mouth 3 (three) times daily.   cyclobenzaprine 10 MG tablet Commonly known as:  FLEXERIL   Fish Oil 1000 MG Cpdr Take by mouth.   FLUoxetine 40 MG capsule Commonly known as:  PROZAC Take 1 capsule (40 mg total) by mouth 2 (two) times daily.   fluticasone 50 MCG/ACT nasal spray Commonly known as:  FLONASE   RED YEAST RICE PO Take by mouth.   traZODone 100 MG  tablet Commonly known as:  DESYREL Take 2 tablets (200 mg total) by mouth at bedtime.   venlafaxine XR 75 MG 24 hr capsule Commonly known as:  EFFEXOR-XR TAKE 1 CAPSULE DAILY WITH BREAKFAST       All past medical history, surgical history, allergies, family history, immunizations andmedications were updated in the EMR today and reviewed under the history and medication portions of their EMR.     ROS: Negative, with the exception of above mentioned in HPI   Objective:  BP 117/73 (BP Location: Left Arm, Patient Position: Sitting, Cuff Size: Large)   Pulse 68   Temp 97.9 F (36.6 C)   Resp 20   Wt 197 lb 8 oz (89.6 kg)   SpO2 97%   BMI 33.90 kg/m  Body mass index is 33.9 kg/m. Gen: Afebrile. No acute distress. Nontoxic in appearance, well developed, well nourished.  HENT: AT. [place]. Bilateral TM visualized Without erythema or swelling. MMM, no oral lesions. Bilateral nares erythema and swelling  right. Throat without erythema or exudates. No postnasal drip, no hoarseness, no cough. Tender to palpation frontal sinus. Eyes:Pupils Equal Round Reactive to light, Extraocular movements intact,  Conjunctiva without redness, discharge or icterus. Neck/lymp/endocrine: Supple, no lymphadenopathy CV: RRR  Chest: CTAB, no wheeze or crackles. .  Neuro:  Normal gait. PERLA. EOMi. Alert. Oriented x3  No exam data present No results found. No results found for this or any previous visit (from the past 24 hour(s)).  Assessment/Plan: Carmen Robinson is a 46 y.o. female present for OV for  Maxillary sinusitis, unspecified chronicity Rest, hydrate.  + flonase, start antihistamine,  nettie pot or nasal saline.  Doxycycline twice a day prescribed, take until completed.  F/U 2 weeks of not improved.   * Of note patient missed her appointment yesterday, and she requested refills on her depression and anxiety medications today. Explained to her we are unable to do this today, this is a same day appointment for an acute illness and not to follow-up on her chronic issues. Advised her to make an appointment at her earliest convenience to follow-up on her chronic medical conditions.    Reviewed expectations re: course of current medical issues.  Discussed self-management of symptoms.  Outlined signs and symptoms indicating need for more acute intervention.  Patient verbalized understanding and all questions were answered.  Patient received an After-Visit Summary.    No orders of the defined types were placed in this encounter.    Note is dictated utilizing voice recognition software. Although note has been proof read prior to signing, occasional typographical errors still can be missed. If any questions arise, please do not hesitate to call for verification.   electronically signed by:  Howard Pouch, DO  [place]

## 2016-11-11 ENCOUNTER — Ambulatory Visit (INDEPENDENT_AMBULATORY_CARE_PROVIDER_SITE_OTHER): Payer: Managed Care, Other (non HMO) | Admitting: Family Medicine

## 2016-11-11 ENCOUNTER — Encounter: Payer: Self-pay | Admitting: Family Medicine

## 2016-11-11 VITALS — BP 115/83 | HR 74 | Temp 98.6°F | Resp 20 | Ht 64.0 in | Wt 198.5 lb

## 2016-11-11 DIAGNOSIS — E785 Hyperlipidemia, unspecified: Secondary | ICD-10-CM | POA: Insufficient documentation

## 2016-11-11 DIAGNOSIS — Z0000 Encounter for general adult medical examination without abnormal findings: Secondary | ICD-10-CM

## 2016-11-11 DIAGNOSIS — E559 Vitamin D deficiency, unspecified: Secondary | ICD-10-CM | POA: Diagnosis not present

## 2016-11-11 DIAGNOSIS — Z23 Encounter for immunization: Secondary | ICD-10-CM

## 2016-11-11 DIAGNOSIS — E782 Mixed hyperlipidemia: Secondary | ICD-10-CM | POA: Diagnosis not present

## 2016-11-11 DIAGNOSIS — Z6834 Body mass index (BMI) 34.0-34.9, adult: Secondary | ICD-10-CM | POA: Insufficient documentation

## 2016-11-11 DIAGNOSIS — F419 Anxiety disorder, unspecified: Secondary | ICD-10-CM

## 2016-11-11 DIAGNOSIS — F331 Major depressive disorder, recurrent, moderate: Secondary | ICD-10-CM

## 2016-11-11 DIAGNOSIS — F5101 Primary insomnia: Secondary | ICD-10-CM | POA: Diagnosis not present

## 2016-11-11 LAB — CBC WITH DIFFERENTIAL/PLATELET
BASOS ABS: 0 10*3/uL (ref 0.0–0.1)
Basophils Relative: 0.6 % (ref 0.0–3.0)
EOS ABS: 0.1 10*3/uL (ref 0.0–0.7)
Eosinophils Relative: 1.5 % (ref 0.0–5.0)
HCT: 42.9 % (ref 36.0–46.0)
Hemoglobin: 14.2 g/dL (ref 12.0–15.0)
LYMPHS ABS: 1.6 10*3/uL (ref 0.7–4.0)
Lymphocytes Relative: 25.5 % (ref 12.0–46.0)
MCHC: 33.2 g/dL (ref 30.0–36.0)
MCV: 94.2 fl (ref 78.0–100.0)
MONO ABS: 0.4 10*3/uL (ref 0.1–1.0)
Monocytes Relative: 6.7 % (ref 3.0–12.0)
NEUTROS ABS: 4 10*3/uL (ref 1.4–7.7)
NEUTROS PCT: 65.7 % (ref 43.0–77.0)
PLATELETS: 299 10*3/uL (ref 150.0–400.0)
RBC: 4.55 Mil/uL (ref 3.87–5.11)
RDW: 12.9 % (ref 11.5–15.5)
WBC: 6.1 10*3/uL (ref 4.0–10.5)

## 2016-11-11 LAB — LIPID PANEL
Cholesterol: 226 mg/dL — ABNORMAL HIGH (ref 0–200)
HDL: 61.9 mg/dL (ref >39.00)
LDL Cholesterol: 141 mg/dL — ABNORMAL HIGH (ref 0–99)
NonHDL: 164.13
TRIGLYCERIDES: 117 mg/dL (ref 0.0–149.0)
Total CHOL/HDL Ratio: 4
VLDL: 23.4 mg/dL (ref 0.0–40.0)

## 2016-11-11 LAB — COMPREHENSIVE METABOLIC PANEL
ALK PHOS: 44 U/L (ref 39–117)
ALT: 11 U/L (ref 0–35)
AST: 12 U/L (ref 0–37)
Albumin: 4 g/dL (ref 3.5–5.2)
BILIRUBIN TOTAL: 0.5 mg/dL (ref 0.2–1.2)
BUN: 17 mg/dL (ref 6–23)
CALCIUM: 9.5 mg/dL (ref 8.4–10.5)
CO2: 30 mEq/L (ref 19–32)
Chloride: 104 mEq/L (ref 96–112)
Creatinine, Ser: 0.8 mg/dL (ref 0.40–1.20)
GFR: 81.52 mL/min (ref >60.00)
Glucose, Bld: 101 mg/dL — ABNORMAL HIGH (ref 70–99)
Potassium: 4.6 mEq/L (ref 3.5–5.1)
SODIUM: 139 meq/L (ref 135–145)
Total Protein: 6.4 g/dL (ref 6.0–8.3)

## 2016-11-11 LAB — TSH: TSH: 2.19 u[IU]/mL (ref 0.35–4.50)

## 2016-11-11 LAB — HEMOGLOBIN A1C: Hgb A1c MFr Bld: 5.6 % (ref 4.6–6.5)

## 2016-11-11 LAB — VITAMIN D 25 HYDROXY (VIT D DEFICIENCY, FRACTURES): VITD: 32.57 ng/mL (ref 30.00–100.00)

## 2016-11-11 MED ORDER — TRAZODONE HCL 100 MG PO TABS: 200.0000 mg | ORAL_TABLET | Freq: Every day | ORAL | 1 refills | Status: DC

## 2016-11-11 MED ORDER — FLUOXETINE HCL 20 MG PO TABS: 20.0000 mg | ORAL_TABLET | Freq: Every day | ORAL | 1 refills | Status: DC

## 2016-11-11 MED ORDER — VENLAFAXINE HCL ER 75 MG PO CP24: ORAL_CAPSULE | ORAL | 1 refills | Status: DC

## 2016-11-11 NOTE — Progress Notes (Signed)
Patient ID: Carmen Robinson, female  DOB: [birth year], 46 y.o.   MRN: [id number] Patient Care Team    Relationship Specialty Notifications Start End  Ma Hillock, DO PCP - General Family Medicine  09/15/14   Brien Few, MD Consulting Physician Obstetrics and Gynecology  09/05/15     Chief Complaint  Patient presents with  . Annual Exam    Subjective:  Carmen Robinson is a 46 y.o.  Female  present for CPE . All past medical history, surgical history, allergies, family history, immunizations, medications and social history were updated in the electronic medical record today. All recent labs, ED visits and hospitalizations within the last year were reviewed.  Doing well with her mental illness. Stable on current regimen. She is having some sexual SE.    Health maintenance:  Colonoscopy: screen at 49 Mammogram: completed:09/14/2013, birads 2.  Cervical cancer screening: last pap: 08/16/2015, results: nl/- HPV, completed by: Raoul Pitch Immunizations: tdap 07/05/2010, Influenza completed today (encouraged yearly) Infectious disease screening: HIV and Hep C completed DEXA: Screen ~ 60-65 Assistive device: none Oxygen YQI:HKVQ Patient has a Dental home. Hospitalizations/ED visits: none  Depression screen [facility] 2/9 11/11/2016 12/25/2015 09/05/2015  Decreased Interest 0 1 0  Down, Depressed, Hopeless 0 3 0  PHQ - 2 Score 0 4 0  Altered sleeping 0 3 -  Tired, decreased energy 0 2 -  Change in appetite 0 1 -  Feeling bad or failure about yourself  0 3 -  Trouble concentrating - 0 -  Moving slowly or fidgety/restless 0 0 -  Suicidal thoughts 0 0 -  PHQ-9 Score 0 13 -   GAD 7 : Generalized Anxiety Score 12/25/2015  Nervous, Anxious, on Edge 3  Control/stop worrying 3  Worry too much - different things 3  Trouble relaxing 1  Restless 0  Easily annoyed or irritable 0  Afraid - awful might happen 3  Total GAD 7 Score 13  Anxiety Difficulty Somewhat difficult     Current  Exercise Habits: The patient does not participate in regular exercise at present Exercise limited by: None identified   Immunization History  Administered Date(s) Administered  . Influenza,inj,Quad PF,6+ Mos 10/13/2014, 09/05/2015, 11/11/2016     Past Medical History:  Diagnosis Date  . Anxiety   . Hyperlipidemia   . Kidney stones   . Mood disorder ([place])   . Neck pain    C4-C5   Allergies  Allergen Reactions  . Iodinated Diagnostic Agents Swelling  . Penicillins   . Shellfish Allergy    Past Surgical History:  Procedure Laterality Date  . ABLATION     uterine  . CESAREAN SECTION    . ENDOMETRIAL ABLATION     Family History  Problem Relation Age of Onset  . Hyperlipidemia Mother   . Fibromyalgia Mother   . Irritable bowel syndrome Mother   . Arthritis Mother   . Diabetes Father   . Asthma Son   . Heart disease Maternal Aunt   . Aneurysm Maternal Aunt   . AAA (abdominal aortic aneurysm) Maternal Uncle   . Stroke Maternal Grandfather   . Heart disease Paternal Grandfather   . Bipolar disorder Brother   . Diabetes Brother   . ADD / ADHD Son    Social History   Socioeconomic History  . Marital status: Married    Spouse name: Not on file  . Number of children: 2  . Years of education: Not on file  .  Highest education level: Not on file  Social Needs  . Financial resource strain: Not on file  . Food insecurity - worry: Not on file  . Food insecurity - inability: Not on file  . Transportation needs - medical: Not on file  . 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 Smoking status: Never Smoker  . Smokeless tobacco: Never Used  Substance and Sexual Activity  . Alcohol use: Yes    Alcohol/week: 0.6 oz    Types: 1 Glasses of wine per week  . Drug use: No  . Sexual activity: Yes    Birth control/protection: Surgical  Other Topics Concern  . Not on file  Social History Narrative   Lives  with husband & 2 kids.   Works [employer].   Wears her seatbelt.   Smoke detector in the home.    Allergies as of 11/11/2016      Reactions   Iodinated Diagnostic Agents Swelling   Penicillins    Shellfish Allergy       Medication List        Accurate as of 11/11/16  9:04 AM. Always use your most recent med list.          FLUoxetine 20 MG tablet Commonly known as:  PROZAC Take 1 tablet (20 mg total) daily by mouth.   traZODone 100 MG tablet Commonly known as:  DESYREL Take 2 tablets (200 mg total) at bedtime by mouth.   venlafaxine XR 75 MG 24 hr capsule Commonly known as:  EFFEXOR-XR TAKE 1 CAPSULE DAILY WITH BREAKFAST       All past medical history, surgical history, allergies, family history, immunizations andmedications were updated in the EMR today and reviewed under the history and medication portions of their EMR.     No results found for this or any previous visit (from the past 2160 hour(s)).  No results found.   ROS: 14 pt review of systems performed and negative (unless mentioned in an HPI)  Objective: BP 115/83 (BP Location: Left Arm, Patient Position: Sitting, Cuff Size: Large)   Pulse 74   Temp 98.6 F (37 C)   Resp 20   Ht '5\' 4"'  (1.626 m)   Wt 198 lb 8 oz (90 kg)   SpO2 97%   BMI 34.07 kg/m  Gen: Afebrile. No acute distress. Nontoxic in appearance, well-developed, well-nourished,  Obese, pleasant caucasian female.  HENT: AT. [place]. Bilateral TM visualized and normal in appearance, normal external auditory canal. MMM, no oral lesions, adequate dentition. Bilateral nares within normal limits. Throat without erythema, ulcerations or exudates. no Cough on exam, no hoarseness on exam. Eyes:Pupils Equal Round Reactive to light, Extraocular movements intact,  Conjunctiva without redness, discharge or icterus. Neck/lymp/endocrine: Supple,no lymphadenopathy, no thyromegaly CV: RRR no murmur, no edema, +2/4 P posterior tibialis pulses. no carotid bruits. No  JVD. Chest: CTAB, no wheeze, rhonchi or crackles. normal Respiratory effort. good Air movement. Abd: Soft. obese. NTND. BS present. no Masses palpated. No hepatosplenomegaly. No rebound tenderness or guarding. Skin: no rashes, purpura or petechiae. Warm and well-perfused. Skin intact. Neuro/Msk:  Normal gait. PERLA. EOMi. Alert. Oriented x3.  Cranial nerves II through XII intact. Muscle strength 5/5 upper/lower extremity. DTRs equal bilaterally. Psych: Normal affect, dress and demeanor. Normal speech. Normal thought content and judgment.  No exam data present  Assessment/plan: Carmen Robinson is a 46 y.o. female present for CPE. Encounter for preventive health  examination Patient was encouraged to exercise greater than 150 minutes a week. Patient was encouraged to choose a diet filled with fresh fruits and vegetables, and lean meats. AVS provided to patient today for education/recommendation on gender specific health and safety maintenance. - Flu shot administered today, all other UTD - Mammogram scheduled at "pink [facility]" at work. She will have them send the report - PAP UTD due 2020 Vitamin D deficiency - currently not taking supplement - VITAMIN D 25 Hydroxy (Vit-D Deficiency, Fractures)  Moderate episode of recurrent major depressive disorder ([place]) Primary insomnia - stable. She is in a good place. Ina relationship. Divorce final in March.  - Can try to cut back on trazodone to 100 mg QHS (sexual SE), use melatonin also.  - Refills on effexor 75 mg Qd and Prozac 20 mg QD - seeing psych PA (FMLA) every 3-6 months.  - f/u 6 months for med refills/recheck  BMI 34.0-34.9,adult/Mixed hyperlipidemia - Diet and exercise discussed. - Currently stopped taking fish oil.  - CBC w/Diff - Comp Met (CMET) - Lipid panel - HgB A1c - TSH Influenza vaccine administered - Flu Vaccine QUAD 6+ mos PF IM (Fluarix Quad PF)   Return in about 1 year (around 11/11/2017) for CPE.  6 months for  depression/anxiety  Electronically signed by: Howard Pouch, DO [place]

## 2016-11-11 NOTE — Patient Instructions (Signed)

## 2017-06-17 ENCOUNTER — Encounter: Payer: Self-pay | Admitting: Family Medicine

## 2017-06-17 ENCOUNTER — Ambulatory Visit (INDEPENDENT_AMBULATORY_CARE_PROVIDER_SITE_OTHER): Payer: Managed Care, Other (non HMO) | Admitting: Family Medicine

## 2017-06-17 VITALS — BP 111/74 | HR 68 | Resp 16 | Ht 63.0 in | Wt 198.0 lb

## 2017-06-17 DIAGNOSIS — H0011 Chalazion right upper eyelid: Secondary | ICD-10-CM

## 2017-06-17 DIAGNOSIS — H01001 Unspecified blepharitis right upper eyelid: Secondary | ICD-10-CM | POA: Diagnosis not present

## 2017-06-17 MED ORDER — ERYTHROMYCIN 5 MG/GM OP OINT: 1.0000 "application " | TOPICAL_OINTMENT | Freq: Every day | OPHTHALMIC | 0 refills | Status: AC

## 2017-06-17 NOTE — Progress Notes (Signed)
Carmen Robinson , July 23, 1970, 47 y.o., female MRN: [id number] Patient Care Team    Relationship Specialty Notifications Start End  Carmen Hillock, DO PCP - General Family Medicine  09/15/14   Carmen Few, MD Consulting Physician Obstetrics and Gynecology  09/05/15     Chief Complaint  Patient presents with  . Belepharitis    Right eye     Subjective: Pt presents for an OV with complaints of redness, swelling right upper eyelid of 4-5 days duration.  Associated symptoms include itchiness. She denies fever, drainage from eye or visual changes. She report symptoms started after using a new eyelash primer. Pt has tried cleansing area to ease their symptoms.   Depression screen [facility] 2/9 11/11/2016 12/25/2015 09/05/2015  Decreased Interest 0 1 0  Down, Depressed, Hopeless 0 3 0  PHQ - 2 Score 0 4 0  Altered sleeping 0 3 -  Tired, decreased energy 0 2 -  Change in appetite 0 1 -  Feeling bad or failure about yourself  0 3 -  Trouble concentrating - 0 -  Moving slowly or fidgety/restless 0 0 -  Suicidal thoughts 0 0 -  PHQ-9 Score 0 13 -    Allergies  Allergen Reactions  . Iodinated Diagnostic Agents Swelling  . Penicillins   . Shellfish Allergy    Social History   Tobacco Use  . Smoking status: Never Smoker  . Smokeless tobacco: Never Used  Substance Use Topics  . Alcohol use: Yes    Alcohol/week: 0.6 oz    Types: 1 Glasses of wine per week   Past Medical History:  Diagnosis Date  . Anxiety   . Hyperlipidemia   . Kidney stones   . Mood disorder ([place])   . Neck pain    C4-C5   Past Surgical History:  Procedure Laterality Date  . ABLATION     uterine  . CESAREAN SECTION    . ENDOMETRIAL ABLATION     Family History  Problem Relation Age of Onset  . Hyperlipidemia Mother   . Fibromyalgia Mother   . Irritable bowel syndrome Mother   . Arthritis Mother   . Diabetes Father   . Asthma Son   . Heart disease Maternal Aunt   . Aneurysm Maternal Aunt   . AAA  (abdominal aortic aneurysm) Maternal Uncle   . Stroke Maternal Grandfather   . Heart disease Paternal Grandfather   . Bipolar disorder Brother   . Diabetes Brother   . ADD / ADHD Son    Allergies as of 06/17/2017      Reactions   Iodinated Diagnostic Agents Swelling   Penicillins    Shellfish Allergy       Medication List        Accurate as of 06/17/17  3:54 PM. Always use your most recent med list.          clonazePAM 0.5 MG tablet Commonly known as:  KLONOPIN TAKE 1 TABLET BY MOUTH TWICE A DAY AS NEEDED FOR ANXIETY   cyclobenzaprine 10 MG tablet Commonly known as:  FLEXERIL Take 10 mg by mouth 2 (two) times daily.   FLUoxetine 20 MG tablet Commonly known as:  PROZAC Take 1 tablet (20 mg total) daily by mouth.   traZODone 100 MG tablet Commonly known as:  DESYREL Take 2 tablets (200 mg total) at bedtime by mouth.   venlafaxine XR 75 MG 24 hr capsule Commonly known as:  EFFEXOR-XR TAKE 1 CAPSULE DAILY WITH BREAKFAST  All past medical history, surgical history, allergies, family history, immunizations andmedications were updated in the EMR today and reviewed under the history and medication portions of their EMR.     ROS: Negative, with the exception of above mentioned in HPI   Objective:  BP 111/74 (BP Location: Right Arm, Patient Position: Sitting, Cuff Size: Normal)   Pulse 68   Resp 16   Ht 5\' 3"  (1.6 m)   Wt 198 lb (89.8 kg)   SpO2 97%   BMI 35.07 kg/m  Body mass index is 35.07 kg/m. Gen: Afebrile. No acute distress. Nontoxic in appearance, well developed, well nourished.  HENT: AT. [place].  MMM Eyes:Pupils Equal Round Reactive to light, Extraocular movements intact,  Conjunctiva without redness, discharge or icterus. Lateral canthus and upper eyelid with mild redness and swelling. Small chalazion right mid upper eyelid Neck/lymp/endocrine: Supple,no lymphadenopathy  No exam data present No results found. No results found for this or any previous  visit (from the past 24 hour(s)).  Assessment/Plan: Carmen Robinson is a 47 y.o. female present for OV for  Blepharitis of right upper eyelid, unspecified type/Chalazion of right upper eyelid - Romycin ointment qhs - warm compresses and J&J baby shampoo cleanses TID.  - chalazion as been present for a Robinson months and is not acute. Discussed these typically have to be removed by an ophthalmologist.  - F/U 1-2 weeks PRN   Reviewed expectations re: course of current medical issues.  Discussed self-management of symptoms.  Outlined signs and symptoms indicating need for more acute intervention.  Patient verbalized understanding and all questions were answered.  Patient received an After-Visit Summary.    No orders of the defined types were placed in this encounter.    Note is dictated utilizing voice recognition software. Although note has been proof read prior to signing, occasional typographical errors still can be missed. If any questions arise, please do not hesitate to call for verification.   electronically signed by:  Carmen Pouch, DO  [place]

## 2017-06-17 NOTE — Patient Instructions (Signed)
Use ointment nightly as discussed for 5-7 days Warm compresses and J&J baby shampoo cleanses with paper towel or disposable towel at least twice a day.    Blepharitis Blepharitis means swollen eyelids. Follow these instructions at home: Pay attention to any changes in how you look or feel. Follow these instructions to help with your condition: Keeping Clean  Wash your hands often.  Wash your eyelids with warm water, or wash them with warm water that is mixed with little bit of baby shampoo. Do this 2 or more times per day.  Wash your face and eyebrows at least once a day.  Use a clean towel each time you dry your eyelids. Do not use the towel to clean or dry other areas of your body. Do not share your towel with anyone. General instructions  Avoid wearing makeup until you get better. Do not share makeup with anyone.  Avoid rubbing your eyes.  Put a warm compress on your eyes 2 times per day for 10 minutes at a time or as told by your doctor.  If you were told to use an medicated cream or eye drops, use the medicine as told by your doctor. Do not stop using the medicine even if you feel better.  Keep all follow-up visits as told by your doctor. This is important. Contact a doctor if:  Your eyelids feel hot.  You have blisters on your eyelids.  You have a rash on your eyelids.  The swelling does not go away in 2-4 days.  The swelling gets worse. Get help right away if:  You have pain that gets worse.  You have pain that spreads to other parts of your face.  You have redness that gets worse.  You have redness that spreads to other parts of your face.  Your vision changes.  You have pain when you look at lights or things that move.  You have a fever. This information is not intended to replace advice given to you by your health care provider. Make sure you discuss any questions you have with your health care provider. Document Released: 10/03/2007 Document Revised:  06/01/2015 Document Reviewed: 04/18/2014 Elsevier Interactive Patient Education  [employer].

## 2017-06-26 ENCOUNTER — Encounter: Payer: Self-pay | Admitting: Family Medicine

## 2017-06-26 ENCOUNTER — Ambulatory Visit (INDEPENDENT_AMBULATORY_CARE_PROVIDER_SITE_OTHER): Payer: Managed Care, Other (non HMO) | Admitting: Family Medicine

## 2017-06-26 VITALS — BP 126/81 | HR 84 | Temp 98.6°F | Resp 16 | Ht 63.0 in | Wt 215.4 lb

## 2017-06-26 DIAGNOSIS — L255 Unspecified contact dermatitis due to plants, except food: Secondary | ICD-10-CM

## 2017-06-26 MED ORDER — METHYLPREDNISOLONE ACETATE 40 MG/ML IJ SUSP
40.0000 mg | Freq: Once | INTRAMUSCULAR | Status: AC
  Administered 2017-06-26: 40 mg via INTRAMUSCULAR

## 2017-06-26 MED ORDER — FLUTICASONE PROPIONATE 0.05 % EX CREA: TOPICAL_CREAM | Freq: Two times a day (BID) | CUTANEOUS | 1 refills | Status: DC

## 2017-06-26 NOTE — Patient Instructions (Signed)
Buy generic over the counter allegra (called fexofenadine) 180mg  , take 1 tab every morning until rash/itching has resolved. You may continue to take benadryl at bedtime.

## 2017-06-26 NOTE — Addendum Note (Signed)
Addended by: Onalee Hua on: 06/26/2017 02:07 PM   Modules accepted: Orders

## 2017-06-26 NOTE — Progress Notes (Signed)
OFFICE VISIT  06/26/2017   CC:  Chief Complaint  Patient presents with  . Rash    Poison Ivy?   HPI:    Patient is a 47 y.o.  female who presents for rash. Onset 1 wk ago, little pink itchy bumps,volar surface of both arms, then it came out on hands.  Now on groin area and under breasts. Thought it was insect bites at first.  Chlorox applied, cortisone otc applied. Very itchy.  Benadryl hs.  No daytime antihistamine.   She was outside some the week prior plus her boyfriend is in [place] ---he has no rash. No potential contact irritants other than plants.  No f/c/malaise.  No swelling of eyes/tongue/face/lips.  No wheezing or SOB.  Recent fracture of L foot: orthopedist discourages use of systemic steroids if it can be avoided.  Past Medical History:  Diagnosis Date  . Anxiety   . Hyperlipidemia   . Kidney stones   . Mood disorder ([place])   . Neck pain    C4-C5    Past Surgical History:  Procedure Laterality Date  . ABLATION     uterine  . CESAREAN SECTION    . ENDOMETRIAL ABLATION      Outpatient Medications Prior to Visit  Medication Sig Dispense Refill  . ciprofloxacin (CIPRO) 500 MG tablet Take 500 mg by mouth 2 (two) times daily.  0  . clonazePAM (KLONOPIN) 0.5 MG tablet TAKE 1 TABLET BY MOUTH TWICE A DAY AS NEEDED FOR ANXIETY  2  . cyclobenzaprine (FLEXERIL) 10 MG tablet Take 10 mg by mouth 2 (two) times daily.  1  . FLUoxetine (PROZAC) 10 MG capsule Take 10 mg by mouth daily. Takes w/ 20mg  to equal 30mg  daily  1  . FLUoxetine (PROZAC) 20 MG tablet Take 1 tablet (20 mg total) daily by mouth. 90 tablet 1  . traZODone (DESYREL) 100 MG tablet Take 2 tablets (200 mg total) at bedtime by mouth. 180 tablet 1  . venlafaxine XR (EFFEXOR-XR) 75 MG 24 hr capsule TAKE 1 CAPSULE DAILY WITH BREAKFAST 90 capsule 1   No facility-administered medications prior to visit.     Allergies  Allergen Reactions  . Iodinated Diagnostic Agents Swelling  . Penicillins   .  Shellfish Allergy     ROS As per HPI  PE: Blood pressure 126/81, pulse 84, temperature 98.6 F (37 C), temperature source Oral, resp. rate 16, height 5\' 3"  (1.6 m), weight 215 lb 6 oz (97.7 kg), SpO2 94 %.  Pt examined with Etheleen Sia, nurse, as chaperone.  Gen: Alert, well appearing.  Patient is oriented to person, place, time, and situation. Scattered splotches of pinkish papular rash on arms, R leg, hands, and under breasts. No pustules or petechiae.  No surrounding erythema.   LABS:    Chemistry      Component Value Date/Time   NA 139 11/11/2016 0902   K 4.6 11/11/2016 0902   CL 104 11/11/2016 0902   CO2 30 11/11/2016 0902   BUN 17 11/11/2016 0902   CREATININE 0.80 11/11/2016 0902      Component Value Date/Time   CALCIUM 9.5 11/11/2016 0902   ALKPHOS 44 11/11/2016 0902   AST 12 11/11/2016 0902   ALT 11 11/11/2016 0902   BILITOT 0.5 11/11/2016 0902       IMPRESSION AND PLAN:  Contact derm, suspected to be due to poison ivy/oak. She is miserable. Due to foot fx, would like to avoid systemic steroids, but I compromised today  and did 40mg  depo medrol injection today instead of oral taper. Cutivate 0.05% cream rx'd, apply bid prn. Buy generic over the counter allegra (called fexofenadine) 180mg  , take 1 tab every morning until rash/itching has resolved. You may continue to take benadryl at bedtime.  An After Visit Summary was printed and given to the patient.  FOLLOW UP: Return if symptoms worsen or fail to improve.  Signed:  Crissie Sickles, MD           06/26/2017

## 2017-06-29 ENCOUNTER — Encounter (HOSPITAL_BASED_OUTPATIENT_CLINIC_OR_DEPARTMENT_OTHER): Payer: Self-pay | Admitting: Emergency Medicine

## 2017-06-29 ENCOUNTER — Emergency Department (HOSPITAL_BASED_OUTPATIENT_CLINIC_OR_DEPARTMENT_OTHER)
Admission: EM | Admit: 2017-06-29 | Discharge: 2017-06-29 | Disposition: A | Discharge status: Home / Self Care | Payer: Managed Care, Other (non HMO) | Attending: Emergency Medicine | Admitting: Emergency Medicine

## 2017-06-29 ENCOUNTER — Emergency Department (HOSPITAL_BASED_OUTPATIENT_CLINIC_OR_DEPARTMENT_OTHER): Payer: Managed Care, Other (non HMO)

## 2017-06-29 ENCOUNTER — Other Ambulatory Visit: Payer: Self-pay

## 2017-06-29 DIAGNOSIS — N201 Calculus of ureter: Secondary | ICD-10-CM | POA: Diagnosis not present

## 2017-06-29 DIAGNOSIS — Z79899 Other long term (current) drug therapy: Secondary | ICD-10-CM | POA: Diagnosis not present

## 2017-06-29 DIAGNOSIS — R102 Pelvic and perineal pain: Secondary | ICD-10-CM | POA: Diagnosis present

## 2017-06-29 LAB — CBC WITH DIFFERENTIAL/PLATELET
Basophils Absolute: 0 10*3/uL (ref 0.0–0.1)
Basophils Relative: 0 %
Eosinophils Absolute: 0.3 10*3/uL (ref 0.0–0.7)
Eosinophils Relative: 2 %
HCT: 41.1 % (ref 36.0–46.0)
Hemoglobin: 14.4 g/dL (ref 12.0–15.0)
Lymphocytes Relative: 18 %
Lymphs Abs: 1.8 10*3/uL (ref 0.7–4.0)
MCH: 31.1 pg (ref 26.0–34.0)
MCHC: 35 g/dL (ref 30.0–36.0)
MCV: 88.8 fL (ref 78.0–100.0)
Monocytes Absolute: 0.6 10*3/uL (ref 0.1–1.0)
Monocytes Relative: 6 %
Neutro Abs: 7.8 10*3/uL — ABNORMAL HIGH (ref 1.7–7.7)
Neutrophils Relative %: 74 %
Platelets: 285 10*3/uL (ref 150–400)
RBC: 4.63 MIL/uL (ref 3.87–5.11)
RDW: 13.1 % (ref 11.5–15.5)
WBC: 10.5 10*3/uL (ref 4.0–10.5)

## 2017-06-29 LAB — COMPREHENSIVE METABOLIC PANEL
ALT: 15 U/L (ref 14–54)
AST: 17 U/L (ref 15–41)
Albumin: 4.1 g/dL (ref 3.5–5.0)
Alkaline Phosphatase: 59 U/L (ref 38–126)
Anion gap: 8 (ref 5–15)
BUN: 14 mg/dL (ref 6–20)
CO2: 24 mmol/L (ref 22–32)
Calcium: 9.1 mg/dL (ref 8.9–10.3)
Chloride: 105 mmol/L (ref 101–111)
Creatinine, Ser: 0.84 mg/dL (ref 0.44–1.00)
GFR calc Af Amer: >60 mL/min (ref >60)
GFR calc non Af Amer: >60 mL/min (ref >60)
Glucose, Bld: 91 mg/dL (ref 65–99)
Potassium: 3.9 mmol/L (ref 3.5–5.1)
Sodium: 137 mmol/L (ref 135–145)
Total Bilirubin: 0.6 mg/dL (ref 0.3–1.2)
Total Protein: 7.5 g/dL (ref 6.5–8.1)

## 2017-06-29 LAB — URINALYSIS, ROUTINE W REFLEX MICROSCOPIC
BILIRUBIN URINE: NEGATIVE
Glucose, UA: NEGATIVE mg/dL
Ketones, ur: NEGATIVE mg/dL
Leukocytes, UA: NEGATIVE
NITRITE: NEGATIVE
PH: 6 (ref 5.0–8.0)
Protein, ur: NEGATIVE mg/dL
Specific Gravity, Urine: <1.005 — ABNORMAL LOW (ref 1.005–1.030)

## 2017-06-29 LAB — URINALYSIS, MICROSCOPIC (REFLEX)

## 2017-06-29 LAB — PREGNANCY, URINE: Preg Test, Ur: NEGATIVE

## 2017-06-29 MED ORDER — IBUPROFEN 800 MG PO TABS: 800.0000 mg | ORAL_TABLET | Freq: Three times a day (TID) | ORAL | 0 refills | Status: DC

## 2017-06-29 MED ORDER — ONDANSETRON HCL 4 MG PO TABS: 4.0000 mg | ORAL_TABLET | Freq: Four times a day (QID) | ORAL | 0 refills | Status: DC

## 2017-06-29 MED ORDER — KETOROLAC TROMETHAMINE 30 MG/ML IJ SOLN
30.0000 mg | Freq: Once | INTRAMUSCULAR | Status: AC
  Administered 2017-06-29: 30 mg via INTRAVENOUS
  Filled 2017-06-29: qty 1

## 2017-06-29 MED ORDER — TAMSULOSIN HCL 0.4 MG PO CAPS: 0.4000 mg | ORAL_CAPSULE | Freq: Every day | ORAL | 0 refills | Status: DC

## 2017-06-29 MED ORDER — HYDROCODONE-ACETAMINOPHEN 5-325 MG PO TABS: 1.0000 | ORAL_TABLET | Freq: Four times a day (QID) | ORAL | 0 refills | Status: DC | PRN

## 2017-06-29 NOTE — ED Triage Notes (Signed)
Patient states that she has a hx of kidney stones in the past - patient states that she was treated for a UTI about 2 weeks ago. Paitnet sates that she started to have pain to her right flank and urinary frequency since 1 pm

## 2017-06-29 NOTE — ED Provider Notes (Signed)
[facility] EMERGENCY DEPARTMENT Provider Note   CSN: [id number] Arrival date & time: 06/29/17  1549     History   Chief Complaint Chief Complaint  Patient presents with  . Flank Pain    HPI Carmen Robinson is a 47 y.o. female with history of kidney stones who presents with sudden onset urinary frequency and right flank pain that began at 1 PM today.  Patient reports she got over a urinary tract infection a couple weeks ago and urinary frequency returned today.  She states it feels like previous kidney stone she has had.  She last had a kidney stone 3 years ago.  She denies any significant abdominal pain, but has had some suprapubic pressure.  She denies any fevers, chest pain, shortness of breath, nausea, vomiting, abnormal vaginal bleeding or discharge.  Patient had uterine ablation and does not have a menstrual cycle.  She did not take any medications prior to arrival.  She does note she had a beer prior to onset of symptoms.  HPI  Past Medical History:  Diagnosis Date  . Anxiety   . Hyperlipidemia   . Kidney stones   . Mood disorder ([place])   . Neck pain    C4-C5    Patient Active Problem List   Diagnosis Date Noted  . Chalazion of right upper eyelid 06/17/2017  . BMI 34.0-34.9,adult 11/11/2016  . Hyperlipidemia 11/11/2016  . Marital maladjustment involving estrangement 03/27/2016  . Degenerative disc disease, cervical 04/10/2015  . Depression 10/13/2014  . Insomnia 10/13/2014  . ADD (attention deficit disorder) 09/15/2014  . Encounter for preventive health examination 07/05/2014  . Vitamin D deficiency 02/23/2014  . Anxiety     Past Surgical History:  Procedure Laterality Date  . ABLATION     uterine  . CESAREAN SECTION    . ENDOMETRIAL ABLATION       OB History   None      Home Medications    Prior to Admission medications   Medication Sig Start Date End Date Taking? Authorizing Provider  FLUoxetine (PROZAC) 10 MG capsule Take 30 mg by  mouth daily. Takes w/ 20mg  to equal 30mg  daily 06/17/17  Yes [provider]  FLUoxetine (PROZAC) 20 MG tablet Take 1 tablet (20 mg total) daily by mouth. 11/11/16  Yes Kuneff, Renee A, DO  traZODone (DESYREL) 100 MG tablet Take 2 tablets (200 mg total) at bedtime by mouth. 11/11/16  Yes Kuneff, Renee A, DO  venlafaxine XR (EFFEXOR-XR) 75 MG 24 hr capsule TAKE 1 CAPSULE DAILY WITH BREAKFAST 11/11/16  Yes Kuneff, Renee A, DO  ciprofloxacin (CIPRO) 500 MG tablet Take 500 mg by mouth 2 (two) times daily. 06/21/17   [provider]  clonazePAM (KLONOPIN) 0.5 MG tablet TAKE 1 TABLET BY MOUTH TWICE A DAY AS NEEDED FOR ANXIETY 05/19/17   [provider]  cyclobenzaprine (FLEXERIL) 10 MG tablet Take 10 mg by mouth 2 (two) times daily. 03/26/17   [provider]  fluticasone (CUTIVATE) 0.05 % cream Apply topically 2 (two) times daily. 06/26/17   McGowen, Adrian Blackwater, MD  HYDROcodone-acetaminophen (NORCO/VICODIN) 5-325 MG tablet Take 1-2 tablets by mouth every 6 (six) hours as needed for severe pain. 06/29/17   Jhamir Pickup, Bea Graff, PA-C  ibuprofen (ADVIL,MOTRIN) 800 MG tablet Take 1 tablet (800 mg total) by mouth 3 (three) times daily. 06/29/17   Mennie Spiller, Bea Graff, PA-C  ondansetron (ZOFRAN) 4 MG tablet Take 1 tablet (4 mg total) by mouth every 6 (six)  hours. 06/29/17   Frederica Kuster, PA-C  tamsulosin (FLOMAX) 0.4 MG CAPS capsule Take 1 capsule (0.4 mg total) by mouth daily after supper. 06/29/17   Frederica Kuster, PA-C    Family History Family History  Problem Relation Age of Onset  . Hyperlipidemia Mother   . Fibromyalgia Mother   . Irritable bowel syndrome Mother   . Arthritis Mother   . Diabetes Father   . Asthma Son   . Heart disease Maternal Aunt   . Aneurysm Maternal Aunt   . AAA (abdominal aortic aneurysm) Maternal Uncle   . Stroke Maternal Grandfather   . Heart disease Paternal Grandfather   . Bipolar disorder Brother   . Diabetes Brother   . ADD / ADHD Son      Social History Social History   Tobacco Use  . Smoking status: Never Smoker  . Smokeless tobacco: Never Used  Substance Use Topics  . Alcohol use: Yes    Alcohol/week: 0.6 oz    Types: 1 Glasses of wine per week  . Drug use: No     Allergies   Iodinated diagnostic agents; Penicillins; and Shellfish allergy   Review of Systems Review of Systems  Constitutional: Negative for chills and fever.  HENT: Negative for facial swelling and sore throat.   Respiratory: Negative for shortness of breath.   Cardiovascular: Negative for chest pain.  Gastrointestinal: Negative for abdominal pain, nausea and vomiting.  Genitourinary: Positive for flank pain (R, intermittent), frequency and hematuria. Negative for dysuria, vaginal bleeding, vaginal discharge and vaginal pain.  Musculoskeletal: Positive for back pain.  Skin: Negative for rash and wound.  Neurological: Negative for headaches.  Psychiatric/Behavioral: The patient is not nervous/anxious.      Physical Exam Updated Vital Signs BP 138/88 (BP Location: Left Arm)   Pulse 74   Temp 98.3 F (36.8 C) (Oral)   Resp 20   Ht 5\' 3"  (1.6 m)   Wt 97.5 kg (215 lb)   SpO2 96%   BMI 38.09 kg/m   Physical Exam  Constitutional: She appears well-developed and well-nourished. No distress.  HENT:  Head: Normocephalic and atraumatic.  Mouth/Throat: Oropharynx is clear and moist. No oropharyngeal exudate.  Eyes: Pupils are equal, round, and reactive to light. Conjunctivae are normal. Right eye exhibits no discharge. Left eye exhibits no discharge. No scleral icterus.  Neck: Normal range of motion. Neck supple. No thyromegaly present.  Cardiovascular: Normal rate, regular rhythm, normal heart sounds and intact distal pulses. Exam reveals no gallop and no friction rub.  No murmur heard. Pulmonary/Chest: Effort normal and breath sounds normal. No stridor. No respiratory distress. She has no wheezes. She has no rales.  Abdominal: Soft.  Bowel sounds are normal. She exhibits no distension. There is tenderness (mild, pressure-like) in the right lower quadrant, suprapubic area and left lower quadrant. There is no rebound, no guarding and no CVA tenderness.  Musculoskeletal: She exhibits no edema.  Lymphadenopathy:    She has no cervical adenopathy.  Neurological: She is alert. Coordination normal.  Skin: Skin is warm and dry. No rash noted. She is not diaphoretic. No pallor.  Psychiatric: She has a normal mood and affect.  Nursing note and vitals reviewed.    ED Treatments / Results  Labs (all labs ordered are listed, but only abnormal results are displayed) Labs Reviewed  URINALYSIS, ROUTINE W REFLEX MICROSCOPIC - Abnormal; Notable for the following components:      Result Value   Specific Gravity, Urine <1.005 (*)  Hgb urine dipstick LARGE (*)    All other components within normal limits  CBC WITH DIFFERENTIAL/PLATELET - Abnormal; Notable for the following components:   Neutro Abs 7.8 (*)    All other components within normal limits  URINALYSIS, MICROSCOPIC (REFLEX) - Abnormal; Notable for the following components:   Bacteria, UA FEW (*)    All other components within normal limits  PREGNANCY, URINE  COMPREHENSIVE METABOLIC PANEL    EKG None  Radiology Ct Renal Stone Study  Result Date: 06/29/2017 CLINICAL DATA:  47 year old female with history of right-sided flank pain and pelvic pain. Urinary tract infection 2 weeks ago on antibiotics. EXAM: CT ABDOMEN AND PELVIS WITHOUT CONTRAST TECHNIQUE: Multidetector CT imaging of the abdomen and pelvis was performed following the standard protocol without IV contrast. COMPARISON:  CT the abdomen and pelvis 05/30/2010. FINDINGS: Lower chest: Unremarkable. Hepatobiliary: No suspicious cystic or solid hepatic lesions are confidently identified on today's noncontrast CT examination. Unenhanced appearance of the gallbladder is normal. Pancreas: No definite pancreatic mass or  peripancreatic fluid or inflammatory changes are noted on today's noncontrast CT examination. Spleen: Unremarkable. Adrenals/Urinary Tract: Multiple nonobstructive calculi are noted within the collecting systems of both kidneys (right greater than left) measuring up to 3 mm in the lower pole collecting system of the right kidney. In addition, in the right side of the urinary bladder (axial image 77 of series 2), at or immediately beyond the right ureterovesicular junction measuring 2 mm. No additional calculi are noted along the course of the left ureter or elsewhere in the urinary bladder. There is no associated proximal hydroureteronephrosis on either side. Unenhanced appearance of the kidneys and bilateral adrenal glands is otherwise unremarkable. Urinary bladder is normal in appearance. Stomach/Bowel: Unenhanced appearance of the stomach is normal. There is no pathologic dilatation of small bowel or colon. Several colonic diverticulae are noted, without surrounding inflammatory changes to suggest an acute diverticulitis at this time. Normal appendix. Vascular/Lymphatic: No atherosclerotic calcifications are noted in the abdominal aorta. No lymphadenopathy noted in the abdomen or pelvis. Reproductive: Uterus and ovaries are unremarkable in appearance. Other: Small left inguinal hernia containing only fat. No significant volume of ascites. No pneumoperitoneum. Musculoskeletal: There are no aggressive appearing lytic or blastic lesions noted in the visualized portions of the skeleton. IMPRESSION: 1. 2 mm calculus in the right side of the urinary bladder at or immediately beyond the right ureterovesicular junction. This is not associated with proximal right hydroureteronephrosis to indicate urinary tract obstruction at this time. 2. Multiple additional nonobstructive calculi in the collecting systems of both kidneys (right greater than left), measuring up to 3 mm on the right side. 3. Colonic diverticulosis without  evidence of acute diverticulitis at this time. 4. Small left inguinal hernia containing only fat. Electronically Signed   By: Vinnie Langton M.D.   On: 06/29/2017 17:14    Procedures Procedures (including critical care time)  Medications Ordered in ED Medications  ketorolac (TORADOL) 30 MG/ML injection 30 mg (30 mg Intravenous Given 06/29/17 1725)     Initial Impression / Assessment and Plan / ED Course  I have reviewed the triage vital signs and the nursing notes.  Pertinent labs & imaging results that were available during my care of the patient were reviewed by me and considered in my medical decision making (see chart for details).     Patient found to have 92mm calculus in the right side of the urinary bladder at or immediately beyond the right ureterovesicular junction found on  CT. This is not associated with proximal right hydroureteronephrosis to indicate urinary tract obstruction at this time. Labs unremarkable.  UA shows large hematuria, but no signs of infection. Pain controlled with Toradol in the ED. Will discharge home with pain control, Zofran, Flomax. Follow up to urology in 1 week. I reviewed the [place] narcotic database and found no discrepancies.  Strict return precautions discussed.  Patient understands and agrees with plan.  Patient vitals stable throughout ED course and discharged in satisfactory condition.  Final Clinical Impressions(s) / ED Diagnoses   Final diagnoses:  Ureterolithiasis    ED Discharge Orders        Ordered    ibuprofen (ADVIL,MOTRIN) 800 MG tablet  3 times daily     06/29/17 1754    HYDROcodone-acetaminophen (NORCO/VICODIN) 5-325 MG tablet  Every 6 hours PRN     06/29/17 1754    ondansetron (ZOFRAN) 4 MG tablet  Every 6 hours     06/29/17 1754    tamsulosin (FLOMAX) 0.4 MG CAPS capsule  Daily after supper     06/29/17 1754       Frederica Kuster, PA-C 06/29/17 1756    Mesner, Corene Cornea, MD 06/29/17 1954

## 2017-06-29 NOTE — Discharge Instructions (Addendum)
Medications: Ibuprofen, Norco, Zofran, Flomax  Treatment: Take ibuprofen every 8 hours as needed for your pain.  For breakthrough pain, you can take 1-2 Norco every 6 hours as needed.  Take Zofran every 6 hours as needed for nausea or vomiting.  Take Flomax once daily to help the passage of your stone.  Follow-up: Please follow-up with the urologist below within 1 week for further evaluation and management of your kidney stones.  Please return to emergency department if you develop any new or worsening symptoms including fever, intractable pain or vomiting, or any other new or concerning symptom.  Do not drink alcohol, drive, operate machinery or participate in any other potentially dangerous activities while taking opiate pain medication as it may make you sleepy. Do not take this medication with any other sedating medications, either prescription or over-the-counter. If you were prescribed Percocet or Vicodin, do not take these with acetaminophen (Tylenol) as it is already contained within these medications and overdose of Tylenol is dangerous.   This medication is an opiate (or narcotic) pain medication and can be habit forming.  Use it as little as possible to achieve adequate pain control.  Do not use or use it with extreme caution if you have a history of opiate abuse or dependence. This medication is intended for your use only - do not give any to anyone else and keep it in a secure place where nobody else, especially children, have access to it. It will also cause or worsen constipation, so you may want to consider taking an over-the-counter stool softener while you are taking this medication.

## 2017-07-18 ENCOUNTER — Telehealth: Payer: Self-pay | Admitting: Family Medicine

## 2017-07-18 ENCOUNTER — Encounter: Payer: Self-pay | Admitting: Family Medicine

## 2017-07-18 ENCOUNTER — Ambulatory Visit (INDEPENDENT_AMBULATORY_CARE_PROVIDER_SITE_OTHER): Payer: Managed Care, Other (non HMO) | Admitting: Family Medicine

## 2017-07-18 VITALS — BP 111/77 | HR 72 | Temp 98.0°F | Resp 16 | Ht 63.0 in | Wt 216.0 lb

## 2017-07-18 DIAGNOSIS — K5903 Drug induced constipation: Secondary | ICD-10-CM | POA: Diagnosis not present

## 2017-07-18 MED ORDER — SENNOSIDES-DOCUSATE SODIUM 8.6-50 MG PO TABS: 1.0000 | ORAL_TABLET | Freq: Two times a day (BID) | ORAL | 0 refills | Status: DC

## 2017-07-18 MED ORDER — BISACODYL 10 MG RE SUPP: 10.0000 mg | RECTAL | 0 refills | Status: DC | PRN

## 2017-07-18 NOTE — Progress Notes (Signed)
Carmen Robinson , Feb 12, 1970, 47 y.o., female MRN: [id number] Patient Care Team    Relationship Specialty Notifications Start End  Ma Hillock, DO PCP - General Family Medicine  09/15/14   Brien Few, MD Consulting Physician Obstetrics and Gynecology  09/05/15     Chief Complaint  Patient presents with  . Abdominal Pain    low abdomen, low back pain Hx of kidney stone last month     Subjective: Pt presents for an OV with complaints of abdominal pain and cramping. Pt reports she passed a kidney stone a few weeks ago. She was seen at urology yesterday to follow up and reports normal work up. She has started vitamin drink and supplement and thinks it is contributing to her symptoms. She is not had a decent bowel movement in 4 days. She had a BM yesterday, that was very small. She is passing flatus. She started Miralax yesterday. She is eating and drinking well.   Depression screen [facility] 2/9 11/11/2016 12/25/2015 09/05/2015  Decreased Interest 0 1 0  Down, Depressed, Hopeless 0 3 0  PHQ - 2 Score 0 4 0  Altered sleeping 0 3 -  Tired, decreased energy 0 2 -  Change in appetite 0 1 -  Feeling bad or failure about yourself  0 3 -  Trouble concentrating - 0 -  Moving slowly or fidgety/restless 0 0 -  Suicidal thoughts 0 0 -  PHQ-9 Score 0 13 -    Allergies  Allergen Reactions  . Iodinated Diagnostic Agents Swelling  . Penicillins   . Shellfish Allergy    Social History   Tobacco Use  . Smoking status: Never Smoker  . Smokeless tobacco: Never Used  Substance Use Topics  . Alcohol use: Yes    Alcohol/week: 0.6 oz    Types: 1 Glasses of wine per week   Past Medical History:  Diagnosis Date  . Anxiety   . Hyperlipidemia   . Kidney stones   . Mood disorder ([place])   . Neck pain    C4-C5   Past Surgical History:  Procedure Laterality Date  . ABLATION     uterine  . CESAREAN SECTION    . ENDOMETRIAL ABLATION     Family History  Problem Relation Age of Onset  .  Hyperlipidemia Mother   . Fibromyalgia Mother   . Irritable bowel syndrome Mother   . Arthritis Mother   . Diabetes Father   . Asthma Son   . Heart disease Maternal Aunt   . Aneurysm Maternal Aunt   . AAA (abdominal aortic aneurysm) Maternal Uncle   . Stroke Maternal Grandfather   . Heart disease Paternal Grandfather   . Bipolar disorder Brother   . Diabetes Brother   . ADD / ADHD Son    Allergies as of 07/18/2017      Reactions   Iodinated Diagnostic Agents Swelling   Penicillins    Shellfish Allergy       Medication List        Accurate as of 07/18/17  4:06 PM. Always use your most recent med list.          ciprofloxacin 500 MG tablet Commonly known as:  CIPRO Take 500 mg by mouth 2 (two) times daily.   clonazePAM 0.5 MG tablet Commonly known as:  KLONOPIN TAKE 1 TABLET BY MOUTH TWICE A DAY AS NEEDED FOR ANXIETY   cyclobenzaprine 10 MG tablet Commonly known as:  FLEXERIL Take 10 mg by  mouth 2 (two) times daily.   FLUoxetine 20 MG tablet Commonly known as:  PROZAC Take 1 tablet (20 mg total) daily by mouth.   FLUoxetine 10 MG capsule Commonly known as:  PROZAC Take 30 mg by mouth daily. Takes w/ 20mg  to equal 30mg  daily   fluticasone 0.05 % cream Commonly known as:  CUTIVATE Apply topically 2 (two) times daily.   HYDROcodone-acetaminophen 5-325 MG tablet Commonly known as:  NORCO/VICODIN Take 1-2 tablets by mouth every 6 (six) hours as needed for severe pain.   ibuprofen 800 MG tablet Commonly known as:  ADVIL,MOTRIN Take 1 tablet (800 mg total) by mouth 3 (three) times daily.   ondansetron 4 MG tablet Commonly known as:  ZOFRAN Take 1 tablet (4 mg total) by mouth every 6 (six) hours.   tamsulosin 0.4 MG Caps capsule Commonly known as:  FLOMAX Take 1 capsule (0.4 mg total) by mouth daily after supper.   traZODone 100 MG tablet Commonly known as:  DESYREL Take 2 tablets (200 mg total) at bedtime by mouth.   venlafaxine XR 75 MG 24 hr  capsule Commonly known as:  EFFEXOR-XR TAKE 1 CAPSULE DAILY WITH BREAKFAST       All past medical history, surgical history, allergies, family history, immunizations andmedications were updated in the EMR today and reviewed under the history and medication portions of their EMR.     ROS: Negative, with the exception of above mentioned in HPI   Objective:  BP 111/77 (BP Location: Right Arm, Patient Position: Sitting, Cuff Size: Large)   Pulse 72   Temp 98 F (36.7 C) (Oral)   Resp 16   Ht 5\' 3"  (1.6 m)   Wt 216 lb (98 kg)   SpO2 98%   BMI 38.26 kg/m  Body mass index is 38.26 kg/m. Gen: Afebrile. No acute distress. Nontoxic in appearance, well developed, well nourished.  HENT: AT. [place]. MMM, no oral lesions.  Eyes:Pupils Equal Round Reactive to light, Extraocular movements intact,  Conjunctiva without redness, discharge or icterus. CV: RRR no murmur, no edema Chest: CTAB, no wheeze or crackles. Good air movement, normal resp effort.  Abd: Soft. NT. Distended, large stool burden. BS present, hypoactive.  No rebound or guarding.  Neuro:  Normal gait. PERLA. EOMi. Alert. Oriented x3  Psych: Normal affect, dress and demeanor. Normal speech. Normal thought content and judgment.  No exam data present No results found. No results found for this or any previous visit (from the past 24 hour(s)).  Assessment/Plan: Carmen Robinson is a 47 y.o. female present for OV for  1. Drug-induced constipation - increase water consumption to > 80 ounces a day.  - Miralax 1 cap BID in 8 oz water. --. Continue until stools daily or diarrhea, then taper back to 1 cap a day. Tapering off completely on if stools are too loose.  - dulco supp daily until large BM.  - Start Senokot, take daily.  - bisacodyl (DULCOLAX) 10 MG suppository; Place 1 suppository (10 mg total) rectally as needed for moderate constipation.  Dispense: 12 suppository; Refill: 0 - senna-docusate (SENOKOT-S) 8.6-50 MG tablet; Take 1  tablet by mouth 2 (two) times daily.  Dispense: 60 tablet; Refill: 0 - f/u 1 week if not improved.     Reviewed expectations re: course of current medical issues.  Discussed self-management of symptoms.  Outlined signs and symptoms indicating need for more acute intervention.  Patient verbalized understanding and all questions were answered.  Patient received an  After-Visit Summary.    No orders of the defined types were placed in this encounter.    Note is dictated utilizing voice recognition software. Although note has been proof read prior to signing, occasional typographical errors still can be missed. If any questions arise, please do not hesitate to call for verification.   electronically signed by:  Howard Pouch, DO  [place]

## 2017-07-18 NOTE — Telephone Encounter (Signed)
Contacted patient to discuss her 4pm appt for "severe back and belly pain"  Patient states she was at work and she works till [employer] so she can't come any earlier.  Patient was short and had to get off the phone due to being at work. She said she would call back.  I advised her to speak with triage nurse when she returned call.

## 2017-07-18 NOTE — Patient Instructions (Addendum)
dulcolax suppository daily until bowel movement.  Senokot pil every 12 hours--> stimulant.  Miralax 1 cap in 8 ounces of water two times a day.  Increase water to at least 80 ounces a day   Continue until stools are daily and soft.    Constipation, Adult Constipation is when a person:  Poops (has a bowel movement) fewer times in a week than normal.  Has a hard time pooping.  Has poop that is dry, hard, or bigger than normal.  Follow these instructions at home: Eating and drinking   Eat foods that have a lot of fiber, such as: ? Fresh fruits and vegetables. ? Whole grains. ? Beans.  Eat less of foods that are high in fat, low in fiber, or overly processed, such as: ? [place] fries. ? Hamburgers. ? Cookies. ? Candy. ? Soda.  Drink enough fluid to keep your pee (urine) clear or pale yellow. General instructions  Exercise regularly or as told by your doctor.  Go to the restroom when you feel like you need to poop. Do not hold it in.  Take over-the-counter and prescription medicines only as told by your doctor. These include any fiber supplements.  Do pelvic floor retraining exercises, such as: ? Doing deep breathing while relaxing your lower belly (abdomen). ? Relaxing your pelvic floor while pooping.  Watch your condition for any changes.  Keep all follow-up visits as told by your doctor. This is important. Contact a doctor if:  You have pain that gets worse.  You have a fever.  You have not pooped for 4 days.  You throw up (vomit).  You are not hungry.  You lose weight.  You are bleeding from the anus.  You have thin, pencil-like poop (stool). Get help right away if:  You have a fever, and your symptoms suddenly get worse.  You leak poop or have blood in your poop.  Your belly feels hard or bigger than normal (is bloated).  You have very bad belly pain.  You feel dizzy or you faint. This information is not intended to replace advice given to  you by your health care provider. Make sure you discuss any questions you have with your health care provider. Document Released: 06/12/2007 Document Revised: 07/14/2015 Document Reviewed: 06/14/2015 Elsevier Interactive Patient Education  2018 [employer].

## 2017-07-18 NOTE — Telephone Encounter (Signed)
Pt returning call to office regarding appt scheduled for today at 4pm. Pt denies any pain at this time but states she did experience severe pain on yesterday. Pt states 3 weeks ago she passed a kidney stone and last week she started hurting again. Pt was seen by urologist on yesterday and had an abdominal ultrasound. Pt states that urologist noted kidney stone floating around but no obstructions and also noted gas on the ultrasound. Pt states Urologist recommended that she follow up with PCP to for recommendations because it was suggested that she might be constipated. Pt states she was using shakes and taking vitamins to loose weight that may have attributed to constipation. Pt also states she did take Miralax on yesterday and had a loose stool on yesterday as well. Pt will still come in for appt at 4pm today.

## 2017-07-18 NOTE — Telephone Encounter (Signed)
Ok. Thank you for clarifying :)

## 2017-11-06 ENCOUNTER — Encounter: Payer: Self-pay | Admitting: Family Medicine

## 2017-11-06 ENCOUNTER — Ambulatory Visit (INDEPENDENT_AMBULATORY_CARE_PROVIDER_SITE_OTHER): Payer: Managed Care, Other (non HMO) | Admitting: Family Medicine

## 2017-11-06 VITALS — BP 128/85 | HR 81 | Temp 98.3°F | Resp 20 | Ht 63.0 in | Wt 213.0 lb

## 2017-11-06 DIAGNOSIS — R51 Headache: Secondary | ICD-10-CM | POA: Diagnosis not present

## 2017-11-06 DIAGNOSIS — R519 Headache, unspecified: Secondary | ICD-10-CM

## 2017-11-06 DIAGNOSIS — J0110 Acute frontal sinusitis, unspecified: Secondary | ICD-10-CM

## 2017-11-06 MED ORDER — FLUTICASONE PROPIONATE 50 MCG/ACT NA SUSP: 2.0000 | Freq: Every day | NASAL | 6 refills | Status: DC

## 2017-11-06 MED ORDER — DOXYCYCLINE HYCLATE 100 MG PO TABS: 100.0000 mg | ORAL_TABLET | Freq: Two times a day (BID) | ORAL | 0 refills | Status: DC

## 2017-11-06 MED ORDER — KETOROLAC TROMETHAMINE 60 MG/2ML IM SOLN
60.0000 mg | Freq: Once | INTRAMUSCULAR | Status: AC
  Administered 2017-11-06: 60 mg via INTRAMUSCULAR

## 2017-11-06 NOTE — Progress Notes (Signed)
Carmen Robinson , April 06, 1970, 47 y.o., female MRN: [id number] Patient Care Team    Relationship Specialty Notifications Start End  Ma Hillock, DO PCP - General Family Medicine  09/15/14   Brien Few, MD Consulting Physician Obstetrics and Gynecology  09/05/15     Chief Complaint  Patient presents with  . URI    sinus pressure,headache  x 2 days     Subjective: Pt presents for an OV with complaints of frontal headache and sinus pressure of 2-3 days duration.  Associated symptoms include Sore throat and fatigue. She denies fever, chills, nausea, ear pain or cough.  Pt has tried sudafed and ibuprofen to ease their symptoms.   Depression screen [facility] 2/9 11/11/2016 12/25/2015 09/05/2015  Decreased Interest 0 1 0  Down, Depressed, Hopeless 0 3 0  PHQ - 2 Score 0 4 0  Altered sleeping 0 3 -  Tired, decreased energy 0 2 -  Change in appetite 0 1 -  Feeling bad or failure about yourself  0 3 -  Trouble concentrating - 0 -  Moving slowly or fidgety/restless 0 0 -  Suicidal thoughts 0 0 -  PHQ-9 Score 0 13 -    Allergies  Allergen Reactions  . Iodinated Diagnostic Agents Swelling  . Penicillins   . Shellfish Allergy    Social History   Tobacco Use  . Smoking status: Never Smoker  . Smokeless tobacco: Never Used  Substance Use Topics  . Alcohol use: Yes    Alcohol/week: 1.0 standard drinks    Types: 1 Glasses of wine per week   Past Medical History:  Diagnosis Date  . Anxiety   . Hyperlipidemia   . Kidney stones   . Mood disorder ([place])   . Neck pain    C4-C5   Past Surgical History:  Procedure Laterality Date  . ABLATION     uterine  . CESAREAN SECTION    . ENDOMETRIAL ABLATION     Family History  Problem Relation Age of Onset  . Hyperlipidemia Mother   . Fibromyalgia Mother   . Irritable bowel syndrome Mother   . Arthritis Mother   . Diabetes Father   . Asthma Son   . Heart disease Maternal Aunt   . Aneurysm Maternal Aunt   . AAA (abdominal  aortic aneurysm) Maternal Uncle   . Stroke Maternal Grandfather   . Heart disease Paternal Grandfather   . Bipolar disorder Brother   . Diabetes Brother   . ADD / ADHD Son    Allergies as of 11/06/2017      Reactions   Iodinated Diagnostic Agents Swelling   Penicillins    Shellfish Allergy       Medication List        Accurate as of 11/06/17 11:20 AM. Always use your most recent med list.          clonazePAM 0.5 MG tablet Commonly known as:  KLONOPIN TAKE 1 TABLET BY MOUTH TWICE A DAY AS NEEDED FOR ANXIETY   cyclobenzaprine 10 MG tablet Commonly known as:  FLEXERIL Take 10 mg by mouth 2 (two) times daily.   FLUoxetine 20 MG tablet Commonly known as:  PROZAC Take 1 tablet (20 mg total) daily by mouth.   FLUoxetine 10 MG capsule Commonly known as:  PROZAC Take 30 mg by mouth daily. Takes w/ 20mg  to equal 30mg  daily   senna-docusate 8.6-50 MG tablet Commonly known as:  Senokot-S Take 1 tablet by mouth 2 (two)  times daily.   tamsulosin 0.4 MG Caps capsule Commonly known as:  FLOMAX Take 1 capsule (0.4 mg total) by mouth daily after supper.   traZODone 100 MG tablet Commonly known as:  DESYREL Take 2 tablets (200 mg total) at bedtime by mouth.   venlafaxine XR 75 MG 24 hr capsule Commonly known as:  EFFEXOR-XR TAKE 1 CAPSULE DAILY WITH BREAKFAST       All past medical history, surgical history, allergies, family history, immunizations andmedications were updated in the EMR today and reviewed under the history and medication portions of their EMR.     ROS: Negative, with the exception of above mentioned in HPI   Objective:  BP 128/85 (BP Location: Right Arm, Patient Position: Sitting, Cuff Size: Large)   Pulse 81   Temp 98.3 F (36.8 C)   Resp 20   Ht 5\' 3"  (1.6 m)   Wt 213 lb (96.6 kg)   SpO2 97%   BMI 37.73 kg/m  Body mass index is 37.73 kg/m. Gen: Afebrile. No acute distress. Nontoxic in appearance, well developed, well nourished.  HENT: AT.  Uncertain. Bilateral TM visualized and normal. MMM, no oral lesions. Bilateral nares with erythema and swelling. Throat without erythema or exudates. No cough or hoarseness. TTP frontal sinus.  Eyes:Pupils Equal Round Reactive to light, Extraocular movements intact,  Conjunctiva without redness, discharge or icterus. Neck/lymp/endocrine: Supple,no lymphadenopathy CV: RRR  Chest: CTAB, no wheeze or crackles. Good air movement, normal resp effort.  Neuro:  Normal gait. PERLA. EOMi. Alert. Oriented x3    No exam data present No results found. No results found for this or any previous visit (from the past 24 hour(s)).  Assessment/Plan: Carmen Robinson is a 47 y.o. female present for OV for  Acute non-recurrent frontal sinusitis/Acute nonintractable headache, unspecified headache type - doxycycline (VIBRA-TABS) 100 MG tablet; Take 1 tablet (100 mg total) by mouth 2 (two) times daily.  Dispense: 20 tablet; Refill: 0 - fluticasone (FLONASE) 50 MCG/ACT nasal spray; Place 2 sprays into both nostrils daily.  Dispense: 16 g; Refill: 6 Rest, hydrate.  OTC:  mucinex,nasal saline, antihistamine (allegra/zyrtec)  Doxycyline prescribed, take until completed if started. If this is a virus, this is not needed. I would wait until Saturday to start if symptoms are not improving or sooner if worsening.  Toradol today  For headache and antiinflammatory affect- do not use any more ibuprofen today- this will cover you. Declined steroid- Increases her anxiety. If cough present it can last up to 6-8 weeks.  F/U 2 weeks of not improved.    Reviewed expectations re: course of current medical issues.  Discussed self-management of symptoms.  Outlined signs and symptoms indicating need for more acute intervention.  Patient verbalized understanding and all questions were answered.  Patient received an After-Visit Summary.    No orders of the defined types were placed in this encounter.    Note is dictated  utilizing voice recognition software. Although note has been proof read prior to signing, occasional typographical errors still can be missed. If any questions arise, please do not hesitate to call for verification.   electronically signed by:  Howard Pouch, DO  [place]

## 2017-11-06 NOTE — Patient Instructions (Addendum)
Rest, hydrate.  OTC:  mucinex,nasal saline, antihistamine (allegra/zyrtec)  Doxycyline prescribed, take until completed if started. If this is a virus, this is not needed. I would wait until Saturday to start if symptoms are not improving or sooner if worsening.  flonase prescribed (nasal spray) Toradol today  For headache and antiinflammatory affect- do not use any more ibuprofen today- this will cover you. If cough present it can last up to 6-8 weeks.  F/U 2 weeks of not improved.     Please help [place] help you:  We are honored you have chosen [facility] for your Primary Care home. Below you will find basic instructions that you may need to access in the future. Please help [place] help you by reading the instructions, which cover many of the frequent questions we experience.   Prescription refills and request:  -In order to allow more efficient response time, please call your pharmacy for all refills. They will forward the request electronically to [place]. This allows for the quickest possible response. Request left on a nurse line can take longer to refill, since these are checked as time allows between office patients and other phone calls.  - refill request can take up to 3-5 working days to complete.  - If request is sent electronically and request is appropiate, it is usually completed in 1-2 business days.  - all patients will need to be seen routinely for all chronic medical conditions requiring prescription medications (see follow-up below). If you are overdue for follow up on your condition, you will be asked to make an appointment and we will call in enough medication to cover you until your appointment (up to 30 days).  - all controlled substances will require a face to face visit to request/refill.  - if you desire your prescriptions to go through a new pharmacy, and have an active script at original pharmacy, you will need to call your pharmacy and have scripts transferred to new  pharmacy. This is completed between the pharmacy locations and not by your provider.    Results: If any images or labs were ordered, it can take up to 1 week to get results depending on the test ordered and the lab/facility running and resulting the test. - Normal or stable results, which do not need further discussion, may be released to your mychart immediately with attached note to you. A call may not be generated for normal results. Please make certain to sign up for mychart. If you have questions on how to activate your mychart you can call the front office.  - If your results need further discussion, our office will attempt to contact you via phone, and if unable to reach you after 2 attempts, we will release your abnormal result to your mychart with instructions.  - All results will be automatically released in mychart after 1 week.  - Your provider will provide you with explanation and instruction on all relevant material in your results. Please keep in mind, results and labs may appear confusing or abnormal to the untrained eye, but it does not mean they are actually abnormal for you personally. If you have any questions about your results that are not covered, or you desire more detailed explanation than what was provided, you should make an appointment with your provider to do so.   Our office handles many outgoing and incoming calls daily. If we have not contacted you within 1 week about your results, please check your mychart to see  if there is a message first and if not, then contact our office.  In helping with this matter, you help decrease call volume, and therefore allow [place] to be able to respond to patients needs more efficiently.   Acute office visits (sick visit):  An acute visit is intended for a new problem and are scheduled in shorter time slots to allow schedule openings for patients with new problems. This is the appropriate visit to discuss a new problem. Problems will not be  addressed by phone call or Echart message. Appointment is needed if requesting treatment. In order to provide you with excellent quality medical care with proper time for you to explain your problem, have an exam and receive treatment with instructions, these appointments should be limited to one new problem per visit. If you experience a new problem, in which you desire to be addressed, please make an acute office visit, we save openings on the schedule to accommodate you. Please do not save your new problem for any other type of visit, let us take care of it properly and quickly for you.   Follow up visits:  Depending on your condition(s) your provider will need to see you routinely in order to provide you with quality care and prescribe medication(s). Most chronic conditions (Example: hypertension, Diabetes, depression/anxiety... etc), require visits a couple times a year. Your provider will instruct you on proper follow up for your personal medical conditions and history. Please make certain to make follow up appointments for your condition as instructed. Failing to do so could result in lapse in your medication treatment/refills. If you request a refill, and are overdue to be seen on a condition, we will always provide you with a 30 day script (once) to allow you time to schedule.    Medicare wellness (well visit): - we have a wonderful Nurse Maudie Mercury), that will meet with you and provide you will yearly medicare wellness visits. These visits should occur yearly (can not be scheduled less than 1 calendar year apart) and cover preventive health, immunizations, advance directives and screenings you are entitled to yearly through your medicare benefits. Do not miss out on your entitled benefits, this is when medicare will pay for these benefits to be ordered for you.  These are strongly encouraged by your provider and is the appropriate type of visit to make certain you are up to date with all preventive health  benefits. If you have not had your medicare wellness exam in the last 12 months, please make certain to schedule one by calling the office and schedule your medicare wellness with Maudie Mercury as soon as possible.   Yearly physical (well visit):  - Adults are recommended to be seen yearly for physicals. Check with your insurance and date of your last physical, most insurances require one calendar year between physicals. Physicals include all preventive health topics, screenings, medical exam and labs that are appropriate for gender/age and history. You may have fasting labs needed at this visit. This is a well visit (not a sick visit), new problems should not be covered during this visit (see acute visit).  - Pediatric patients are seen more frequently when they are younger. Your provider will advise you on well child visit timing that is appropriate for your their age. - This is not a medicare wellness visit. Medicare wellness exams do not have an exam portion to the visit. Some medicare companies allow for a physical, some do not allow a yearly physical. If your medicare  allows a yearly physical you can schedule the medicare wellness with our nurse Maudie Mercury and have your physical with your provider after, on the same day. Please check with insurance for your full benefits.   Late Policy/No Shows:  - all new patients should arrive 15-30 minutes earlier than appointment to allow [place] time  to  obtain all personal demographics,  insurance information and for you to complete office paperwork. - All established patients should arrive 10-15 minutes earlier than appointment time to update all information and be checked in .  - In our best efforts to run on time, if you are late for your appointment you will be asked to either reschedule or if able, we will work you back into the schedule. There will be a wait time to work you back in the schedule,  depending on availability.  - If you are unable to make it to your appointment  as scheduled, please call 24 hours ahead of time to allow [place] to fill the time slot with someone else who needs to be seen. If you do not cancel your appointment ahead of time, you may be charged a no show fee.

## 2017-11-07 ENCOUNTER — Telehealth: Payer: Self-pay | Admitting: *Deleted

## 2017-11-07 NOTE — Telephone Encounter (Signed)
Copied from [place] [id number]. Topic: General - Other >> Nov 07, 2017  9:20 AM Leward Quan A wrote: Reason for CRM: Patient request a call back from Dr Raoul Pitch nurse please. Ph# [phone] >> Nov 07, 2017  1:38 PM Judyann Munson wrote: Patient is request a doctor notes to return on Monday 11-10-17 instead, she stated she is not feeling any better. And she will come into the office and pick it up. Please advise

## 2017-11-07 NOTE — Telephone Encounter (Signed)
Note completed placed up front for pick up. Tried to contact patient mail box full unable to leave message.

## 2017-11-07 NOTE — Telephone Encounter (Signed)
Ok to write out today also. She can return to work on Monday.

## 2017-11-13 ENCOUNTER — Other Ambulatory Visit: Payer: Self-pay

## 2017-11-13 ENCOUNTER — Telehealth: Payer: Self-pay

## 2017-11-13 MED ORDER — L-METHYLFOLATE 15 MG PO TABS: 15.0000 mg | ORAL_TABLET | Freq: Every day | ORAL | 1 refills | Status: DC

## 2017-11-13 MED ORDER — FLUOXETINE HCL 10 MG PO CAPS: 10.0000 mg | ORAL_CAPSULE | Freq: Every day | ORAL | 1 refills | Status: DC

## 2017-11-13 MED ORDER — TRAZODONE HCL 100 MG PO TABS: ORAL_TABLET | ORAL | 1 refills | Status: DC

## 2017-11-13 MED ORDER — VENLAFAXINE HCL ER 37.5 MG PO CP24: 37.5000 mg | ORAL_CAPSULE | Freq: Every day | ORAL | 1 refills | Status: DC

## 2017-11-13 MED ORDER — FLUOXETINE HCL 20 MG PO TABS: 20.0000 mg | ORAL_TABLET | Freq: Every day | ORAL | 1 refills | Status: DC

## 2017-11-13 NOTE — Telephone Encounter (Signed)
Pt asking for 2 week supply of fluoxetine 10mg  (faxed) to [facility], and a 90 day be sent to [facility]. Looks like order was faxed but she's never received. E-scribed them all again to [facility]. Pt also asking about the [place], she said it was beneficial. Explained insurances usually deny it but we can try to submit it and do a PA. Suggested goodrx and [facility] if insurance won't cover. Will contact pt once we get notification from pharmacy.

## 2017-11-27 LAB — HM MAMMOGRAPHY

## 2017-11-28 ENCOUNTER — Encounter: Payer: Self-pay | Admitting: Family Medicine

## 2017-12-19 ENCOUNTER — Telehealth: Payer: Self-pay

## 2017-12-19 NOTE — Telephone Encounter (Signed)
Please inform patient the following information: I can see [facility] wanted additional views of her left breast  and they were going to contact her to arrange getting those views scheduled.  I would recommend she call the [facility] and they should be able to arrange this for her.  Their # is [phone].

## 2017-12-19 NOTE — Telephone Encounter (Signed)
Copied from [place] [phone]. Topic: General - Other >> Dec 19, 2017  1:01 PM Yvette Rack wrote: Reason for CRM: Pt stated she had mammogram done [place] and the imaging and results were sent to OBGYN Dr Ronita Hipps. Pt would like to know if Dr. Raoul Pitch has access to the imaging and results. Pt requests a call back today. Cb# [phone] >> Dec 19, 2017  2:16 PM Ralph Dowdy, CMA wrote: Did Dr Raoul Pitch receive these results?

## 2017-12-19 NOTE — Telephone Encounter (Signed)
Pt notified of results and that the breast center w [facility] would like for pt to schedule a dx mammogram for additional views. Pt voiced that she didn't want to go to [facility] but wanted to go to Dr. Kennith Maes office for this, advised pt to call their office to see if they do special dx mammograms. I gave pt the phone number to [facility] and advised that see will need to call them if shes decides to have the additional mammogram views at another location, so that they can send the original mammogram results to the location she is going too.

## 2018-01-26 ENCOUNTER — Telehealth: Payer: Self-pay

## 2018-01-26 NOTE — Telephone Encounter (Signed)
Copied from [place] [phone]. Topic: General - Other >> Jan 20, 2018 10:02 AM Leward Quan A wrote: Reason for CRM: Patient called to say that she has an accomodation form from her job that need to be filled out and signed by her PCP. Patient will be faxing form over so Dr Raoul Pitch can fill it out but if there are any questions or other concerns please call patient. Ph# [phone]

## 2018-01-28 ENCOUNTER — Other Ambulatory Visit: Payer: Self-pay | Admitting: Radiology

## 2018-02-02 ENCOUNTER — Telehealth: Payer: Self-pay | Admitting: Psychiatry

## 2018-02-02 NOTE — Telephone Encounter (Signed)
Carmen Robinson has been diagnosed with breast cancer. She is feeling more depressed and hard to function, get out of bed, etc. Can she increase her depression medication? Or any other adjustment you recommend. Appt is 2/24.

## 2018-02-04 NOTE — Telephone Encounter (Signed)
Please advise/review

## 2018-02-06 DIAGNOSIS — Z171 Estrogen receptor negative status [ER-]: Secondary | ICD-10-CM | POA: Insufficient documentation

## 2018-02-06 DIAGNOSIS — C50312 Malignant neoplasm of lower-inner quadrant of left female breast: Secondary | ICD-10-CM | POA: Insufficient documentation

## 2018-02-16 ENCOUNTER — Ambulatory Visit (INDEPENDENT_AMBULATORY_CARE_PROVIDER_SITE_OTHER): Payer: BLUE CROSS/BLUE SHIELD | Admitting: Psychiatry

## 2018-02-16 ENCOUNTER — Encounter: Payer: Self-pay | Admitting: Psychiatry

## 2018-02-16 VITALS — BP 121/80 | HR 88

## 2018-02-16 DIAGNOSIS — F331 Major depressive disorder, recurrent, moderate: Secondary | ICD-10-CM | POA: Diagnosis not present

## 2018-02-16 DIAGNOSIS — F411 Generalized anxiety disorder: Secondary | ICD-10-CM | POA: Diagnosis not present

## 2018-02-16 MED ORDER — BREXPIPRAZOLE 0.5 MG PO TABS: ORAL_TABLET | ORAL | 0 refills | Status: DC

## 2018-02-16 NOTE — Progress Notes (Signed)
Carmen Robinson [id number] 02-Feb-1970 48 y.o.  Subjective:   Patient ID:  Carmen Robinson is a 48 y.o. (DOB September 11, 1970) female.  Chief Complaint:  Chief Complaint  Patient presents with  . Depression  . Anxiety    HPI Carmen Robinson presents to the office today for follow-up of worsening depression. She reports that she has been dx'd with breast cancer and is trying to process this. She reports that her mood has been depressed. Reports occasional irritability and is irritated with people constantly talking to her about breast cancer. Energy and motivation have been low. Reports that she has been wanting to withdraw recently. Reports periods of anxiety and feeling nervous. Reports periods of chest tightness. Reports taking Klonopin prn daily since dx of breast cancer. She reports some worry and trying to take one day at a time. She reports adequate sleep with Trazodone. She reports her concentration is "alright" with some difficulty with focus. Reports some difficulty trying to figure out her bills, upcoming expenses, and decrease in income. She reports that her appetite has been fair. Denies SI.   Reports that she is in a relationship that is going well.   Past Psychiatric Medication Trials: Klonopin Prozac- Jittery at 40 mg po qd. Some benefit at 30 mg qd.  Effexor XR- Side effects on doses above 75 mg qd Zoloft- "Numb" Lamictal-cognitive side effects Adderall Trazodone- Effective Deplin  Review of Systems:  Review of Systems  Constitutional:       Night sweats  Musculoskeletal: Negative for gait problem.  Neurological: Negative for tremors.  Psychiatric/Behavioral:       Please refer to HPI   Has been dx'd with breast cancer, Stage 1. Had porta cath placed and has been out of work the last several days. Reports that she will start chemo for 5 months this Friday. Will have genetic testing to determine if she will need mastectomy and/or hysterectomy.    Medications: I have  reviewed the patient's current medications.  Current Outpatient Medications  Medication Sig Dispense Refill  . clonazePAM (KLONOPIN) 0.5 MG tablet TAKE 1 TABLET BY MOUTH TWICE A DAY AS NEEDED FOR ANXIETY  2  . FLUoxetine (PROZAC) 10 MG capsule Take 1 capsule (10 mg total) by mouth daily. Takes w/ 20mg  to equal 30mg  daily 90 capsule 1  . FLUoxetine (PROZAC) 20 MG tablet Take 1 tablet (20 mg total) by mouth daily. 90 tablet 1  . traZODone (DESYREL) 100 MG tablet Take 1/2 - 1 tablet at bedtime for insomnia 90 tablet 1  . venlafaxine XR (EFFEXOR XR) 37.5 MG 24 hr capsule Take 1 capsule (37.5 mg total) by mouth daily with breakfast. 90 capsule 1  . Brexpiprazole (REXULTI) 0.5 MG TABS Take 1 tablet (0.5 mg total) by mouth daily for 7 days, THEN 2 tablets (1 mg total) daily for 21 days. 30 tablet 0  . cyclobenzaprine (FLEXERIL) 10 MG tablet Take 10 mg by mouth 2 (two) times daily.  1  . fluticasone (FLONASE) 50 MCG/ACT nasal spray Place 2 sprays into both nostrils daily. (Patient not taking: Reported on 02/16/2018) 16 g 6  . senna-docusate (SENOKOT-S) 8.6-50 MG tablet Take 1 tablet by mouth 2 (two) times daily. (Patient not taking: Reported on 02/16/2018) 60 tablet 0   No current facility-administered medications for this visit.     Medication Side Effects: None  Allergies:  Allergies  Allergen Reactions  . Iodinated Diagnostic Agents Swelling  . Penicillins   . Shellfish Allergy   .  Prednisone Anxiety    Past Medical History:  Diagnosis Date  . Anxiety   . Hyperlipidemia   . Kidney stones   . Mood disorder ([employer])   . Neck pain    C4-C5    Family History  Problem Relation Age of Onset  . Hyperlipidemia Mother   . Fibromyalgia Mother   . Irritable bowel syndrome Mother   . Arthritis Mother   . Anxiety disorder Mother   . Diabetes Father   . Asthma Son   . Heart disease Maternal Aunt   . Aneurysm Maternal Aunt   . AAA (abdominal aortic aneurysm) Maternal Uncle   . Stroke  Maternal Grandfather   . Heart disease Paternal Grandfather   . Bipolar disorder Brother   . Diabetes Brother   . ADD / ADHD Son     Social History   Socioeconomic History  . Marital status: Married    Spouse name: Not on file  . Number of children: 2  . Years of education: Not on file  . 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 Financial resource strain: Not on file  . Food insecurity:    Worry: Not on file    Inability: Not on file  . Transportation needs:    Medical: Not on file    Non-medical: Not on file  Tobacco Use  . Smoking status: Never Smoker  . Smokeless tobacco: Never Used  Substance and Sexual Activity  . Alcohol use: Yes    Alcohol/week: 1.0 standard drinks    Types: 1 Glasses of wine per week  . Drug use: No  . Sexual activity: Yes    Birth control/protection: Surgical  Lifestyle  . Physical activity:    Days per week: Not on file    Minutes per session: Not on file  . Stress: Not on file  Relationships  . Social connections:    Talks on phone: Not on file    Gets together: Not on file    Attends religious service: Not on file    Active member of club or organization: Not on file    Attends meetings of clubs or organizations: Not on file    Relationship status: Not on file  . Intimate partner violence:    Fear of current or ex partner: Not on file    Emotionally abused: Not on file    Physically abused: Not on file    Forced sexual activity: Not on file  Other Topics Concern  . Not on file  Social History Narrative   Lives with husband & 2 kids.   Works [employer].   Wears her seatbelt.   Smoke detector in the home.     Past Medical History, Surgical history, Social history, and Family history were reviewed and updated as appropriate.   Please see review of systems for further details on the patient's review from today.   Objective:   Physical Exam:  BP 121/80   Pulse 88    Physical Exam Constitutional:      General: She is not in acute distress.    Appearance: She is well-developed.  Musculoskeletal:        General: No deformity.  Neurological:     Mental Status: She is alert and oriented to person, place, and time.     Coordination: Coordination normal.  Psychiatric:        Attention and Perception: Attention and perception normal.  She does not perceive auditory or visual hallucinations.        Mood and Affect: Mood is anxious and depressed. Affect is not labile, blunt, angry or inappropriate.        Speech: Speech normal.        Behavior: Behavior normal.        Thought Content: Thought content normal. Thought content does not include homicidal or suicidal ideation. Thought content does not include homicidal or suicidal plan.        Cognition and Memory: Cognition and memory normal.        Judgment: Judgment normal.     Comments: Insight intact. No delusions.      Lab Review:     Component Value Date/Time   NA 137 06/29/2017 1628   K 3.9 06/29/2017 1628   CL 105 06/29/2017 1628   CO2 24 06/29/2017 1628   GLUCOSE 91 06/29/2017 1628   BUN 14 06/29/2017 1628   CREATININE 0.84 06/29/2017 1628   CALCIUM 9.1 06/29/2017 1628   PROT 7.5 06/29/2017 1628   ALBUMIN 4.1 06/29/2017 1628   AST 17 06/29/2017 1628   ALT 15 06/29/2017 1628   ALKPHOS 59 06/29/2017 1628   BILITOT 0.6 06/29/2017 1628   GFRNONAA >60 06/29/2017 1628   GFRAA >60 06/29/2017 1628       Component Value Date/Time   WBC 10.5 06/29/2017 1628   RBC 4.63 06/29/2017 1628   HGB 14.4 06/29/2017 1628   HCT 41.1 06/29/2017 1628   PLT 285 06/29/2017 1628   MCV 88.8 06/29/2017 1628   MCH 31.1 06/29/2017 1628   MCHC 35.0 06/29/2017 1628   RDW 13.1 06/29/2017 1628   LYMPHSABS 1.8 06/29/2017 1628   MONOABS 0.6 06/29/2017 1628   EOSABS 0.3 06/29/2017 1628   BASOSABS 0.0 06/29/2017 1628    No results found for: POCLITH, LITHIUM   No results found for: PHENYTOIN, PHENOBARB,  VALPROATE, CBMZ   .res Assessment: Plan:   Discussed potential tx options to include changing Prozac to a different antidepressant since 30 mg has been only partially effective for her mood and anxiety and she has not been able to tolerate doses of 40 mg and higher. Discussed augmentation of Prozac is another possible tx option and discussed potential benefits, risks, and side effects of Rexulti and Abilify. Discussed potential metabolic side effects associated with atypical antipsychotics, as well as potential risk for movement side effects. Advised pt to contact office if movement side effects occur. Pt agrees to trial of Rexulti. Will start Rexulti 0.5 mg po qd x 1 week, then increase to 1 mg po qd for depression and possibly anxiety.  Continue Prozac 30 mg po qd for depression and anxiety.  Continue Effexor XR 37.5 mg po q am.  Continue Klonopin prn.   Major depressive disorder, recurrent episode, moderate (HCC)  Generalized anxiety disorder  Please see After Visit Summary for patient specific instructions.  Future Appointments  Date Time Provider [place]  03/23/2018 11:00 AM Thayer Headings, PMHNP CP-CP None    No orders of the defined types were placed in this encounter.     -------------------------------

## 2018-03-02 ENCOUNTER — Ambulatory Visit: Payer: Self-pay | Admitting: Psychiatry

## 2018-03-13 DIAGNOSIS — Z1379 Encounter for other screening for genetic and chromosomal anomalies: Secondary | ICD-10-CM | POA: Insufficient documentation

## 2018-03-14 DIAGNOSIS — T451X5A Adverse effect of antineoplastic and immunosuppressive drugs, initial encounter: Secondary | ICD-10-CM | POA: Insufficient documentation

## 2018-03-14 DIAGNOSIS — D701 Agranulocytosis secondary to cancer chemotherapy: Secondary | ICD-10-CM | POA: Insufficient documentation

## 2018-03-23 ENCOUNTER — Ambulatory Visit: Payer: BLUE CROSS/BLUE SHIELD | Admitting: Psychiatry

## 2018-04-28 ENCOUNTER — Other Ambulatory Visit: Payer: Self-pay | Admitting: Psychiatry

## 2018-04-29 ENCOUNTER — Other Ambulatory Visit: Payer: Self-pay

## 2018-04-29 ENCOUNTER — Telehealth: Payer: Self-pay | Admitting: Psychiatry

## 2018-04-29 MED ORDER — FLUOXETINE HCL 10 MG PO CAPS: 10.0000 mg | ORAL_CAPSULE | Freq: Every day | ORAL | 0 refills | Status: DC

## 2018-04-29 MED ORDER — FLUOXETINE HCL 10 MG PO CAPS: 10.0000 mg | ORAL_CAPSULE | Freq: Every day | ORAL | 2 refills | Status: DC

## 2018-04-29 MED ORDER — FLUOXETINE HCL 20 MG PO CAPS: ORAL_CAPSULE | ORAL | 0 refills | Status: DC

## 2018-04-29 MED ORDER — FLUOXETINE HCL 10 MG PO CAPS: 10.0000 mg | ORAL_CAPSULE | Freq: Every day | ORAL | 1 refills | Status: DC

## 2018-04-29 NOTE — Telephone Encounter (Signed)
Patient callcalled and said that she needs 10 mg of [facility]. She also needs a two week supply of the 10 and 20 mg [facility] [place] [facility] gets her mail order

## 2018-04-29 NOTE — Telephone Encounter (Signed)
Last 2 appts cxl, ok to send 90 day? No scheduled appt currently

## 2018-04-29 NOTE — Telephone Encounter (Signed)
For mail order Fluoxetine 10 mg #[facility] Submitting Fluoxetine 10 mg #14 and Fluoxetine 20 mg #14 while waiting on mail order.

## 2018-04-29 NOTE — Telephone Encounter (Signed)
All submitted

## 2018-06-09 ENCOUNTER — Ambulatory Visit (INDEPENDENT_AMBULATORY_CARE_PROVIDER_SITE_OTHER): Payer: BC Managed Care – PPO | Admitting: Family Medicine

## 2018-06-09 ENCOUNTER — Encounter: Payer: Self-pay | Admitting: Family Medicine

## 2018-06-09 ENCOUNTER — Other Ambulatory Visit: Payer: Self-pay

## 2018-06-09 VITALS — Ht 63.0 in | Wt 212.0 lb

## 2018-06-09 DIAGNOSIS — L237 Allergic contact dermatitis due to plants, except food: Secondary | ICD-10-CM | POA: Diagnosis not present

## 2018-06-09 MED ORDER — FLUTICASONE PROPIONATE 0.05 % EX CREA: TOPICAL_CREAM | Freq: Two times a day (BID) | CUTANEOUS | 0 refills | Status: DC

## 2018-06-09 MED ORDER — PREDNISONE 20 MG PO TABS: 20.0000 mg | ORAL_TABLET | Freq: Every day | ORAL | 0 refills | Status: DC

## 2018-06-09 MED ORDER — HYDROXYZINE PAMOATE 25 MG PO CAPS: ORAL_CAPSULE | ORAL | 0 refills | Status: DC

## 2018-06-09 NOTE — Progress Notes (Signed)
VIRTUAL VISIT VIA VIDEO  I connected with [name] on 06/09/18 at  1:40 PM EDT by a video enabled telemedicine application and verified that I am speaking with the correct person using two identifiers. Location patient: Home Location provider: [facility], Office Persons participating in the virtual visit: Patient, Dr. Raoul Pitch and R.Baker, LPN  I discussed the limitations of evaluation and management by telemedicine and the availability of in person appointments. The patient expressed understanding and agreed to proceed.   SUBJECTIVE Chief Complaint  Patient presents with  . Poison Oak    Bilateral ankles, hands, arms, and chest x1 week. Pt is using a steroid cream she had from previous times she has had poison oak.     HPI: Carmen Robinson is a 48 y.o. she reports plant dermatitis outbreak after working in the yard approximately 10 days ago.  She has a rash on her bilateral ankles, hands, arms and chest.  She has been using a steroid cream she got last year when she had poison ivy.  It does help some with the itching but her rash is still spreading.  ROS: See pertinent positives and negatives per HPI.  Patient Active Problem List   Diagnosis Date Noted  . Chalazion of right upper eyelid 06/17/2017  . BMI 34.0-34.9,adult 11/11/2016  . Hyperlipidemia 11/11/2016  . Marital maladjustment involving estrangement 03/27/2016  . Degenerative disc disease, cervical 04/10/2015  . Depression 10/13/2014  . Insomnia 10/13/2014  . ADD (attention deficit disorder) 09/15/2014  . Vitamin D deficiency 02/23/2014  . Anxiety     Social History   Tobacco Use  . Smoking status: Never Smoker  . Smokeless tobacco: Never Used  Substance Use Topics  . Alcohol use: Yes    Alcohol/week: 1.0 standard drinks    Types: 1 Glasses of wine per week    Current Outpatient Medications:  .  FLUoxetine (PROZAC) 10 MG capsule, Take 1 capsule (10 mg total) by mouth daily. Takes w/ 20mg  to  equal 30mg  daily, Disp: 90 capsule, Rfl: 2 .  FLUoxetine (PROZAC) 20 MG capsule, TAKE 1 CAPSULE (20 MG TOTAL) DAILY WITH A 10 MG CAPSULE TO EQUAL TOTAL DAILY DOSE OF 30 MG, Disp: 14 capsule, Rfl: 0 .  traZODone (DESYREL) 100 MG tablet, Take 1/2 - 1 tablet at bedtime for insomnia, Disp: 90 tablet, Rfl: 1 .  venlafaxine XR (EFFEXOR XR) 37.5 MG 24 hr capsule, Take 1 capsule (37.5 mg total) by mouth daily with breakfast., Disp: 90 capsule, Rfl: 1  Allergies  Allergen Reactions  . Iodinated Diagnostic Agents Swelling  . Silver Rash  . Penicillins   . Shellfish Allergy   . Prednisone Anxiety    OBJECTIVE: Ht 5\' 3"  (1.6 m)   Wt 212 lb (96.2 kg)   BMI 37.55 kg/m  Gen: No acute distress. Nontoxic in appearance.  HENT: AT. [place].  MMM.  Eyes:Pupils Equal Round Reactive to light, Extraocular movements intact,  Conjunctiva without redness, discharge or icterus. Skin: red raised vesicular rash arms, chest, legs, ankles, No purpura or petechiae.  Neuro:  Alert. Oriented x3  Psych: Normal affect, dress and demeanor. Normal speech. Normal thought content and judgment.  ASSESSMENT AND PLAN: [name] is a 48 y.o. female present for  Plant allergic contact dermatitis Patient with contact dermatitis after working in the yard approximately 10 days ago. -Continue steroid cream, refills provided today. -Prednisone taper prescribed. -Vistaril 25-50 mg 3 times daily as needed prescribed. -Follow-up PRN  >  15 minutes spent with patient, >50% of time spent face to face counseling     Howard Pouch, DO 06/09/2018

## 2018-07-20 ENCOUNTER — Other Ambulatory Visit: Payer: Self-pay

## 2018-07-20 MED ORDER — VENLAFAXINE HCL ER 37.5 MG PO CP24: 37.5000 mg | ORAL_CAPSULE | Freq: Every day | ORAL | 0 refills | Status: DC

## 2018-07-20 MED ORDER — TRAZODONE HCL 100 MG PO TABS: ORAL_TABLET | ORAL | 0 refills | Status: DC

## 2018-07-30 ENCOUNTER — Other Ambulatory Visit: Payer: Self-pay | Admitting: Psychiatry

## 2018-10-19 ENCOUNTER — Other Ambulatory Visit: Payer: Self-pay | Admitting: Psychiatry

## 2018-10-19 NOTE — Telephone Encounter (Signed)
Last visit 02/2017

## 2019-03-09 ENCOUNTER — Other Ambulatory Visit: Payer: Self-pay | Admitting: Psychiatry

## 2019-03-09 NOTE — Telephone Encounter (Signed)
Last apt 02/2018

## 2019-05-19 ENCOUNTER — Other Ambulatory Visit: Payer: Self-pay | Admitting: Psychiatry

## 2019-05-19 NOTE — Telephone Encounter (Signed)
Last visit 02/2018

## 2019-05-20 ENCOUNTER — Other Ambulatory Visit: Payer: Self-pay | Admitting: Psychiatry

## 2019-05-21 ENCOUNTER — Telehealth: Payer: Self-pay | Admitting: Psychiatry

## 2019-05-21 ENCOUNTER — Other Ambulatory Visit: Payer: Self-pay

## 2019-05-21 MED ORDER — FLUOXETINE HCL 10 MG PO CAPS: ORAL_CAPSULE | ORAL | 0 refills | Status: DC

## 2019-05-21 MED ORDER — FLUOXETINE HCL 20 MG PO CAPS: ORAL_CAPSULE | ORAL | 0 refills | Status: DC

## 2019-05-21 NOTE — Telephone Encounter (Signed)
Patient called and said she needs a 7-10 day supply of her prozac 20 mg [facility] [place] while she waits for the [facility]

## 2019-05-21 NOTE — Telephone Encounter (Signed)
10 day supply of Prozac 10 mg and [facility]ac [facility]

## 2019-06-14 ENCOUNTER — Ambulatory Visit: Payer: BLUE CROSS/BLUE SHIELD | Admitting: Psychiatry

## 2019-07-05 ENCOUNTER — Encounter: Payer: Self-pay | Admitting: Psychiatry

## 2019-07-05 ENCOUNTER — Telehealth (INDEPENDENT_AMBULATORY_CARE_PROVIDER_SITE_OTHER): Payer: BC Managed Care – PPO | Admitting: Psychiatry

## 2019-07-05 VITALS — BP 128/73 | HR 93 | Wt 220.0 lb

## 2019-07-05 DIAGNOSIS — R5383 Other fatigue: Secondary | ICD-10-CM

## 2019-07-05 DIAGNOSIS — F411 Generalized anxiety disorder: Secondary | ICD-10-CM | POA: Diagnosis not present

## 2019-07-05 DIAGNOSIS — F331 Major depressive disorder, recurrent, moderate: Secondary | ICD-10-CM | POA: Diagnosis not present

## 2019-07-05 MED ORDER — MODAFINIL 100 MG PO TABS: ORAL_TABLET | ORAL | 1 refills | Status: DC

## 2019-07-05 MED ORDER — FLUOXETINE HCL 10 MG PO CAPS: ORAL_CAPSULE | ORAL | 0 refills | Status: DC

## 2019-07-05 MED ORDER — TRAZODONE HCL 100 MG PO TABS: ORAL_TABLET | ORAL | 3 refills | Status: DC

## 2019-07-05 MED ORDER — VENLAFAXINE HCL ER 37.5 MG PO CP24: ORAL_CAPSULE | ORAL | 1 refills | Status: DC

## 2019-07-05 MED ORDER — FLUOXETINE HCL 20 MG PO CAPS: ORAL_CAPSULE | ORAL | 0 refills | Status: DC

## 2019-07-05 MED ORDER — CLONAZEPAM 0.5 MG PO TABS: 0.5000 mg | ORAL_TABLET | Freq: Two times a day (BID) | ORAL | 0 refills | Status: DC | PRN

## 2019-07-05 NOTE — Progress Notes (Signed)
Carmen Robinson [id number] Dec 23, 1970 49 y.o.  Virtual Visit via Telephone Note  I connected with pt on 07/05/19 at 10:30 AM EDT by telephone and verified that I am speaking with the correct person using two identifiers.   I discussed the limitations, risks, security and privacy concerns of performing an evaluation and management service by telephone and the availability of in person appointments. I also discussed with the patient that there may be a patient responsible charge related to this service. The patient expressed understanding and agreed to proceed.   I discussed the assessment and treatment plan with the patient. The patient was provided an opportunity to ask questions and all were answered. The patient agreed with the plan and demonstrated an understanding of the instructions.   The patient was advised to call back or seek an in-person evaluation if the symptoms worsen or if the condition fails to improve as anticipated.  I provided 30 minutes of non-face-to-face time during this encounter.  The patient was located at home.  The provider was located at [place].   Thayer Headings, PMHNP   Subjective:   Patient ID:  Carmen Robinson is a 49 y.o. (DOB 1970/06/03) female.  Chief Complaint:  Chief Complaint  Patient presents with  . Fatigue  . Follow-up    Depression and anxiety    HPI Carmen Robinson presents for follow-up of depression and anxiety. She reports that she has completed tx for breast cancer. She reports that she returned to work in September 2020 with [employer]. She reports that she worked 40 hours last week and felt exhausted. She reports feeling "tired all the time" and is having to rest more and oncologist attributes this to chemotherapy. She reports that her motivation has also been low. Denies sad mood- "I'm just tired." She reports that concentration has been adequate. She reports that she has difficulty remembering things and has to take  notes to remember things. She reports that she has had 50 lbs wt gain. No change in appetite. Denies irritability. Denies anxiety. She reports that she is sleeping well. May take a 2 hour nap when she gets home. Sleeping about 8.5 hours a night. Denies SI.   She reports that she has not taken Klonopin prn recently.    Past Psychiatric Medication Trials: Klonopin Prozac- Jittery at 40 mg po qd. Some benefit at 30 mg qd.  Effexor XR- Side effects on doses above 75 mg qd Zoloft- "Numb" Lamictal-cognitive side effects Adderall Trazodone- Effective Hydroxyzine- prescribed for poison ivy Deplin  Review of Systems:  Review of Systems  Musculoskeletal: Negative for gait problem.  Neurological: Negative for tremors and headaches.  Psychiatric/Behavioral:       Please refer to HPI    Medications: I have reviewed the patient's current medications.  Current Outpatient Medications  Medication Sig Dispense Refill  . FLUoxetine (PROZAC) 10 MG capsule TAKE 1 CAPSULE DAILY TAKE WITH 20 MG TO EQUAL 30 MG DAILY 90 capsule 0  . FLUoxetine (PROZAC) 20 MG capsule Take 1 capsule (20 mg) by mouth with 10 mg to equal 30 mg daily 90 capsule 0  . traZODone (DESYREL) 100 MG tablet TAKE ONE-HALF (1/2) TO ONE TABLET AT BEDTIME FOR INSOMNIA 90 tablet 3  . venlafaxine XR (EFFEXOR-XR) 37.5 MG 24 hr capsule TAKE 1 CAPSULE DAILY WITH BREAKFAST 90 capsule 1  . clonazePAM (KLONOPIN) 0.5 MG tablet Take 1 tablet (0.5 mg total) by mouth 2 (two) times daily as needed. 60 tablet 0  .  modafinil (PROVIGIL) 100 MG tablet Take 1/2-1.5 tab po qd 30 tablet 1   No current facility-administered medications for this visit.    Medication Side Effects: None  Allergies:  Allergies  Allergen Reactions  . Iodinated Diagnostic Agents Swelling  . Silver Rash  . Penicillins   . Shellfish Allergy   . Prednisone Anxiety    Past Medical History:  Diagnosis Date  . Anxiety   . Hyperlipidemia   . Kidney stones   . Mood  disorder ([place])   . Neck pain    C4-C5    Family History  Problem Relation Age of Onset  . Hyperlipidemia Mother   . Fibromyalgia Mother   . Irritable bowel syndrome Mother   . Arthritis Mother   . Anxiety disorder Mother   . Diabetes Father   . Asthma Son   . Heart disease Maternal Aunt   . Aneurysm Maternal Aunt   . AAA (abdominal aortic aneurysm) Maternal Uncle   . Stroke Maternal Grandfather   . Heart disease Paternal Grandfather   . Bipolar disorder Brother   . Diabetes Brother   . ADD / ADHD Son     Social History   Socioeconomic History  . Marital status: Married    Spouse name: Not on file  . Number of children: 2  . Years of education: Not on file  . 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 Smoking status: Never Smoker  . Smokeless tobacco: Never Used  Vaping Use  . Vaping Use: Never used  Substance and Sexual Activity  . Alcohol use: Yes    Alcohol/week: 1.0 standard drink    Types: 1 Glasses of wine per week  . Drug use: No  . Sexual activity: Yes    Birth control/protection: Surgical  Other Topics Concern  . Not on file  Social History Narrative   Lives with husband & 2 kids.   Works [employer].   Wears her seatbelt.   Smoke detector in the home.    Social Determinants of Health   Financial Resource Strain:   . Difficulty of Paying Living Expenses:   Food Insecurity:   . Worried About Charity fundraiser in the Last Year:   . Arboriculturist in the Last Year:   Transportation Needs:   . Film/video editor (Medical):   Marland Kitchen Lack of Transportation (Non-Medical):   Physical Activity:   . Days of Exercise per Week:   . Minutes of Exercise per Session:   Stress:   . Feeling of Stress :   Social Connections:   . Frequency of Communication with Friends and Family:   . Frequency of Social Gatherings with Friends and Family:   . Attends Religious Services:   . Active Member of Clubs or  Organizations:   . Attends Archivist Meetings:   Marland Kitchen Marital Status:   Intimate Partner Violence:   . Fear of Current or Ex-Partner:   . Emotionally Abused:   Marland Kitchen Physically Abused:   . Sexually Abused:     Past Medical History, Surgical history, Social history, and Family history were reviewed and updated as appropriate.   Please see review of systems for further details on the patient's review from today.   Objective:   Physical Exam:  BP 128/73   Pulse 93   Wt 220 lb (99.8 kg)   BMI 38.97 kg/m   Physical Exam Neurological:  Mental Status: She is alert and oriented to person, place, and time.     Cranial Nerves: No dysarthria.  Psychiatric:        Attention and Perception: Perception normal. She is inattentive.        Mood and Affect: Mood normal.        Speech: Speech normal.        Behavior: Behavior is slowed. Behavior is cooperative.        Thought Content: Thought content normal. Thought content is not paranoid or delusional. Thought content does not include homicidal or suicidal ideation. Thought content does not include homicidal or suicidal plan.        Cognition and Memory: Cognition and memory normal.        Judgment: Judgment normal.     Comments: Insight intact     Lab Review:     Component Value Date/Time   NA 137 06/29/2017 1628   K 3.9 06/29/2017 1628   CL 105 06/29/2017 1628   CO2 24 06/29/2017 1628   GLUCOSE 91 06/29/2017 1628   BUN 14 06/29/2017 1628   CREATININE 0.84 06/29/2017 1628   CALCIUM 9.1 06/29/2017 1628   PROT 7.5 06/29/2017 1628   ALBUMIN 4.1 06/29/2017 1628   AST 17 06/29/2017 1628   ALT 15 06/29/2017 1628   ALKPHOS 59 06/29/2017 1628   BILITOT 0.6 06/29/2017 1628   GFRNONAA >60 06/29/2017 1628   GFRAA >60 06/29/2017 1628       Component Value Date/Time   WBC 10.5 06/29/2017 1628   RBC 4.63 06/29/2017 1628   HGB 14.4 06/29/2017 1628   HCT 41.1 06/29/2017 1628   PLT 285 06/29/2017 1628   MCV 88.8 06/29/2017  1628   MCH 31.1 06/29/2017 1628   MCHC 35.0 06/29/2017 1628   RDW 13.1 06/29/2017 1628   LYMPHSABS 1.8 06/29/2017 1628   MONOABS 0.6 06/29/2017 1628   EOSABS 0.3 06/29/2017 1628   BASOSABS 0.0 06/29/2017 1628    No results found for: POCLITH, LITHIUM   No results found for: PHENYTOIN, PHENOBARB, VALPROATE, CBMZ   .res Assessment: Plan:   Discussed potential benefits, risks, and side effects of Modafinil and discussed that this is used off label for fatigue, excessive daytime somnolence, and impaired concentration which she reports experiencing after recent chemotherapy. Pt agrees to trial of Modafinil 100 mg 1/2-1.5 tabs po qd. She reports that she would prefer to start with low dose since she has been sensitive to some medications that cause activation.  Discussed intermittent FMLA leave for fatigue and excessive somnolence. Continue Prozac 30 mg po qd for depression and anxiety.  Continue Effexor XR 37. 5 mg po qd for depression and anxiety.  Continue Trazodone for insomnia.  Pt to f/u in 4 weeks or sooner if clinically indicated.  Patient advised to contact office with any questions, adverse effects, or acute worsening in signs and symptoms.  Tammi was seen today for fatigue and follow-up.  Diagnoses and all orders for this visit:  Fatigue, unspecified type -     modafinil (PROVIGIL) 100 MG tablet; Take 1/2-1.5 tab po qd  Major depressive disorder, recurrent episode, moderate (HCC) -     FLUoxetine (PROZAC) 20 MG capsule; Take 1 capsule (20 mg) by mouth with 10 mg to equal 30 mg daily -     FLUoxetine (PROZAC) 10 MG capsule; TAKE 1 CAPSULE DAILY TAKE WITH 20 MG TO EQUAL 30 MG DAILY -     traZODone (DESYREL) 100 MG tablet;  TAKE ONE-HALF (1/2) TO ONE TABLET AT BEDTIME FOR INSOMNIA -     venlafaxine XR (EFFEXOR-XR) 37.5 MG 24 hr capsule; TAKE 1 CAPSULE DAILY WITH BREAKFAST  Generalized anxiety disorder -     clonazePAM (KLONOPIN) 0.5 MG tablet; Take 1 tablet (0.5 mg total) by  mouth 2 (two) times daily as needed.    Please see After Visit Summary for patient specific instructions.  Future Appointments  Date Time Provider [place]  08/02/2019 10:30 AM Thayer Headings, PMHNP CP-CP None    No orders of the defined types were placed in this encounter.     -------------------------------

## 2019-07-07 ENCOUNTER — Telehealth: Payer: Self-pay | Admitting: Psychiatry

## 2019-07-07 NOTE — Telephone Encounter (Signed)
Job Engineering geologist received from [employer] Given to nurse.

## 2019-07-15 NOTE — Telephone Encounter (Signed)
Forms completed, given to Barnett Applebaum to sign since Janett Billow is out this week. The forms didn't actually require a signature but felt it best to be signed before being faxed back.  The forms are for an accommodation for patient to be off 1/day per week. Janett Billow did approve.

## 2019-08-02 ENCOUNTER — Ambulatory Visit: Payer: BC Managed Care – PPO | Admitting: Psychiatry

## 2019-08-23 ENCOUNTER — Other Ambulatory Visit: Payer: Self-pay

## 2019-08-23 ENCOUNTER — Ambulatory Visit (INDEPENDENT_AMBULATORY_CARE_PROVIDER_SITE_OTHER): Payer: BC Managed Care – PPO | Admitting: Psychiatry

## 2019-08-23 ENCOUNTER — Encounter: Payer: Self-pay | Admitting: Psychiatry

## 2019-08-23 DIAGNOSIS — F411 Generalized anxiety disorder: Secondary | ICD-10-CM | POA: Diagnosis not present

## 2019-08-23 DIAGNOSIS — F331 Major depressive disorder, recurrent, moderate: Secondary | ICD-10-CM

## 2019-08-23 MED ORDER — BUPROPION HCL ER (SR) 100 MG PO TB12: 100.0000 mg | ORAL_TABLET | Freq: Every morning | ORAL | 1 refills | Status: DC

## 2019-08-23 MED ORDER — CLONAZEPAM 0.5 MG PO TABS: 0.5000 mg | ORAL_TABLET | Freq: Two times a day (BID) | ORAL | 1 refills | Status: DC | PRN

## 2019-08-23 NOTE — Progress Notes (Signed)
[name] [id number] 09/17/1970 49 y.o.  Subjective:   Patient ID:  Carmen Robinson is a 49 y.o. (DOB 1970-11-08) female.  Chief Complaint:  Chief Complaint  Patient presents with  . Fatigue    HPI Carmen Robinson presents to the office today for follow-up of anxiety, depression, and fatigue. She reports that Modafinil has been helpful for energy but causes her to feel somewhat jittery and have headaches. She reports that it will help in the morning until early afternoon. She reports that she is "still tired." She reports that she has been missing work due to fatigue. She reports that she has been needing to use intermittent FMLA at least one day per week. She reports that she missed 2 days last week which extended FMLA intermittent leave. She reports feeling overwhelmed. She reports some irritability.  She reports excessive fatigue. Low motivation. Has some anxiety, ie. Thinking that they need to do more to prepare son for college and is feeling to fatigued to do so. Sleeping well with Trazodone. Appetite has been good. She reports that she is concerned about her weight. Concentration has been ok. Denies SI.   She reports that she has a child getting ready to start college.    Past Psychiatric Medication Trials: Klonopin Prozac- Jittery at 40 mg po qd. Some benefit at 30 mg qd.  Effexor XR- Side effects on doses above 75 mg qd Zoloft- "Numb" Lamictal-cognitive side effects Adderall Trazodone- Effective Hydroxyzine- prescribed for poison ivy [place]  GAD-7     Office Visit from 12/25/2015 in [place]  Total GAD-7 Score 13    PHQ2-9     Office Visit from 11/11/2016 in [place] Office Visit from 12/25/2015 in [place] Office Visit from 09/05/2015 in [place]  PHQ-2 Total Score 0 4 0  PHQ-9 Total Score 0 13 --       Review of Systems:  Review of Systems  Musculoskeletal:  Negative for gait problem.  Neurological: Positive for headaches.  Psychiatric/Behavioral:       Please refer to HPI    Medications: I have reviewed the patient's current medications.  Current Outpatient Medications  Medication Sig Dispense Refill  . buPROPion (WELLBUTRIN SR) 100 MG 12 hr tablet Take 1 tablet (100 mg total) by mouth in the morning. 30 tablet 1  . clonazePAM (KLONOPIN) 0.5 MG tablet Take 1 tablet (0.5 mg total) by mouth 2 (two) times daily as needed. 60 tablet 1  . FLUoxetine (PROZAC) 10 MG capsule TAKE 1 CAPSULE DAILY TAKE WITH 20 MG TO EQUAL 30 MG DAILY 90 capsule 0  . FLUoxetine (PROZAC) 20 MG capsule Take 1 capsule (20 mg) by mouth with 10 mg to equal 30 mg daily 90 capsule 0  . traZODone (DESYREL) 100 MG tablet TAKE ONE-HALF (1/2) TO ONE TABLET AT BEDTIME FOR INSOMNIA 90 tablet 3  . venlafaxine XR (EFFEXOR-XR) 37.5 MG 24 hr capsule TAKE 1 CAPSULE DAILY WITH BREAKFAST 90 capsule 1   No current facility-administered medications for this visit.    Medication Side Effects: Headache and Other: jitteriness  Allergies:  Allergies  Allergen Reactions  . Iodinated Diagnostic Agents Swelling  . Silver Rash  . Penicillins   . Shellfish Allergy   . Prednisone Anxiety    Past Medical History:  Diagnosis Date  . Anxiety   . Hyperlipidemia   . Kidney stones   .  Mood disorder ([place])   . Neck pain    C4-C5    Family History  Problem Relation Age of Onset  . Hyperlipidemia Mother   . Fibromyalgia Mother   . Irritable bowel syndrome Mother   . Arthritis Mother   . Anxiety disorder Mother   . Diabetes Father   . Asthma Son   . Heart disease Maternal Aunt   . Aneurysm Maternal Aunt   . AAA (abdominal aortic aneurysm) Maternal Uncle   . Stroke Maternal Grandfather   . Heart disease Paternal Grandfather   . Bipolar disorder Brother   . Diabetes Brother   . ADD / ADHD Son     Social History   Socioeconomic History  . Marital status: Married    Spouse name:  Not on file  . Number of children: 2  . Years of education: Not on file  . 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 Smoking status: Never Smoker  . Smokeless tobacco: Never Used  Vaping Use  . Vaping Use: Never used  Substance and Sexual Activity  . Alcohol use: Yes    Alcohol/week: 1.0 standard drink    Types: 1 Glasses of wine per week  . Drug use: No  . Sexual activity: Yes    Birth control/protection: Surgical  Other Topics Concern  . Not on file  Social History Narrative   Lives with husband & 2 kids.   Works [employer].   Wears her seatbelt.   Smoke detector in the home.    Social Determinants of Health   Financial Resource Strain:   . Difficulty of Paying Living Expenses:   Food Insecurity:   . Worried About Charity fundraiser in the Last Year:   . Arboriculturist in the Last Year:   Transportation Needs:   . Film/video editor (Medical):   Marland Kitchen Lack of Transportation (Non-Medical):   Physical Activity:   . Days of Exercise per Week:   . Minutes of Exercise per Session:   Stress:   . Feeling of Stress :   Social Connections:   . Frequency of Communication with Friends and Family:   . Frequency of Social Gatherings with Friends and Family:   . Attends Religious Services:   . Active Member of Clubs or Organizations:   . Attends Archivist Meetings:   Marland Kitchen Marital Status:   Intimate Partner Violence:   . Fear of Current or Ex-Partner:   . Emotionally Abused:   Marland Kitchen Physically Abused:   . Sexually Abused:     Past Medical History, Surgical history, Social history, and Family history were reviewed and updated as appropriate.   Please see review of systems for further details on the patient's review from today.   Objective:   Physical Exam:  BP (!) 133/93   Pulse 76   Physical Exam Constitutional:      General: She is not in acute distress. Musculoskeletal:        General: No deformity.   Neurological:     Mental Status: She is alert and oriented to person, place, and time.     Coordination: Coordination normal.  Psychiatric:        Attention and Perception: Attention and perception normal. She does not perceive auditory or visual hallucinations.        Mood and Affect: Mood is depressed. Mood is not anxious. Affect is not labile, blunt,  angry or inappropriate.        Speech: Speech normal.        Behavior: Behavior normal.        Thought Content: Thought content normal. Thought content is not paranoid or delusional. Thought content does not include homicidal or suicidal ideation. Thought content does not include homicidal or suicidal plan.        Cognition and Memory: Cognition and memory normal.        Judgment: Judgment normal.     Comments: Insight intact     Lab Review:     Component Value Date/Time   NA 137 06/29/2017 1628   K 3.9 06/29/2017 1628   CL 105 06/29/2017 1628   CO2 24 06/29/2017 1628   GLUCOSE 91 06/29/2017 1628   BUN 14 06/29/2017 1628   CREATININE 0.84 06/29/2017 1628   CALCIUM 9.1 06/29/2017 1628   PROT 7.5 06/29/2017 1628   ALBUMIN 4.1 06/29/2017 1628   AST 17 06/29/2017 1628   ALT 15 06/29/2017 1628   ALKPHOS 59 06/29/2017 1628   BILITOT 0.6 06/29/2017 1628   GFRNONAA >60 06/29/2017 1628   GFRAA >60 06/29/2017 1628       Component Value Date/Time   WBC 10.5 06/29/2017 1628   RBC 4.63 06/29/2017 1628   HGB 14.4 06/29/2017 1628   HCT 41.1 06/29/2017 1628   PLT 285 06/29/2017 1628   MCV 88.8 06/29/2017 1628   MCH 31.1 06/29/2017 1628   MCHC 35.0 06/29/2017 1628   RDW 13.1 06/29/2017 1628   LYMPHSABS 1.8 06/29/2017 1628   MONOABS 0.6 06/29/2017 1628   EOSABS 0.3 06/29/2017 1628   BASOSABS 0.0 06/29/2017 1628    No results found for: POCLITH, LITHIUM   No results found for: PHENYTOIN, PHENOBARB, VALPROATE, CBMZ   .res Assessment: Plan:   Will change FMLA to 8 days per month.  Discussed using Klonopin prn and reports that  she has not taken it recently.  Can try stopping Effexor XR after 1-2 weeks.  Discussed potential benefits, risks, and side effects of Wellbutrin. Discussed using Wellbutrin SR instead of XL since it is available in a 100 mg dose and patient reports history of having jitteriness with other medications. Continue Prozac 30 mg daily for anxiety depression. Continue trazodone for insomnia. Patient to follow-up in 4 to 6 weeks or sooner if clinically indicated. Patient advised to contact office with any questions, adverse effects, or acute worsening in signs and symptoms.  Destyni was seen today for fatigue.  Diagnoses and all orders for this visit:  Generalized anxiety disorder -     clonazePAM (KLONOPIN) 0.5 MG tablet; Take 1 tablet (0.5 mg total) by mouth 2 (two) times daily as needed.  Major depressive disorder, recurrent episode, moderate (HCC) -     buPROPion (WELLBUTRIN SR) 100 MG 12 hr tablet; Take 1 tablet (100 mg total) by mouth in the morning.     Please see After Visit Summary for patient specific instructions.  Future Appointments  Date Time Provider [place]  09/20/2019 10:00 AM Thayer Headings, PMHNP CP-CP None    No orders of the defined types were placed in this encounter.   -------------------------------

## 2019-09-01 ENCOUNTER — Telehealth: Payer: Self-pay | Admitting: Psychiatry

## 2019-09-01 NOTE — Telephone Encounter (Signed)
I will try to call back tomorrow, once I can see her paperwork

## 2019-09-01 NOTE — Telephone Encounter (Signed)
Gearlean Alf from HR @ [employer] called asking Thayer Headings to return call to clarify ADA Packet she completed. Return call @ [phone]

## 2019-09-02 NOTE — Telephone Encounter (Signed)
Left message for Carmen Robinson to call back to discuss.

## 2019-09-03 NOTE — Telephone Encounter (Signed)
LM again for Terrica to call back

## 2019-09-06 NOTE — Telephone Encounter (Signed)
Noted thank you

## 2019-09-06 NOTE — Telephone Encounter (Signed)
Carmen Robinson called back and discussed patient's situation concerning her ADA paperwork, they are asking if the days per month can be 5/month instead of 8. Patient is only scheduled 20 days/month and if she is missing that each month she's only working 12 days/ month which puts them in a bind. They said if we can do an exception for August since patient missed more that would also be acceptable. She missed 08/14/2019, 08/19/2019, 08/26/2019 and 08/28/2019.   Recommend updating block #9 on her paperwork and faxing back to her if agreed upon. She has spoke with the patient has well explaining the situation.   I currently have the paperwork with me, okay to update?

## 2019-09-07 NOTE — Telephone Encounter (Signed)
Paperwork updated on #9 and given to Janett Billow to review and initial.

## 2019-09-16 ENCOUNTER — Other Ambulatory Visit: Payer: Self-pay | Admitting: Psychiatry

## 2019-09-16 DIAGNOSIS — F331 Major depressive disorder, recurrent, moderate: Secondary | ICD-10-CM

## 2019-09-16 NOTE — Telephone Encounter (Signed)
Please review

## 2019-09-20 ENCOUNTER — Other Ambulatory Visit: Payer: Self-pay

## 2019-09-20 ENCOUNTER — Ambulatory Visit (INDEPENDENT_AMBULATORY_CARE_PROVIDER_SITE_OTHER): Payer: BC Managed Care – PPO | Admitting: Psychiatry

## 2019-09-20 ENCOUNTER — Encounter: Payer: Self-pay | Admitting: Psychiatry

## 2019-09-20 DIAGNOSIS — F331 Major depressive disorder, recurrent, moderate: Secondary | ICD-10-CM

## 2019-09-20 MED ORDER — BUPROPION HCL ER (XL) 150 MG PO TB24: 150.0000 mg | ORAL_TABLET | Freq: Every day | ORAL | 1 refills | Status: DC

## 2019-09-20 MED ORDER — FLUOXETINE HCL 20 MG PO CAPS: ORAL_CAPSULE | ORAL | 0 refills | Status: DC

## 2019-09-20 MED ORDER — FLUOXETINE HCL 10 MG PO CAPS: ORAL_CAPSULE | ORAL | 0 refills | Status: DC

## 2019-09-20 NOTE — Progress Notes (Signed)
Carmen Robinson [id number] Aug 26, 1970 49 y.o.  Subjective:   Patient ID:  Carmen Robinson is a 49 y.o. (DOB 1970/04/26) female.  Chief Complaint:  Chief Complaint  Patient presents with  . Follow-up    Depression and anxiety    HPI SHAUNTAE REITMAN presents to the office today for follow-up of depression and anxiety. She reports that Wellbutrin seemed to be initially helping with energy and mood and now is noticing more fatigue. She reports that she has been sleeping excessively. Slept until 11 am over the weekend and then wanted to lay back down. Notices some lower mood.  Denies anxiety. Denies irritability. Motivation is also low. She reports that she is ok "once I get started." She reports that she has difficulty getting started and getting going in the morning. Appetite is ok. Denies decrease in appetite. She reports that she would like to lose weight however she has had difficulty with intentional weight loss efforts. Concentration has improved. Denies SI.   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 Was able to get her son off to college and reports that this went well.   Past Psychiatric Medication Trials: Klonopin Prozac- Jittery at 40 mg po qd. Some benefit at 30 mg qd.  Effexor XR- Side effects on doses above 75 mg qd Zoloft- "Numb" Lamictal-cognitive side effects Adderall Trazodone- Effective Hydroxyzine- prescribed for poison ivy [place]  GAD-7     Office Visit from 12/25/2015 in [place]  Total GAD-7 Score 13    PHQ2-9     Office Visit from 11/11/2016 in [facility] Office Visit from 12/25/2015 in [facility] Office Visit from 09/05/2015 in [facility]  PHQ-2 Total Score 0 4 0  PHQ-9 Total Score 0 13 --       Review of Systems:  Review of Systems  Cardiovascular: Negative for palpitations.  Musculoskeletal: Negative for gait problem.   Neurological: Negative for tremors and headaches.  Psychiatric/Behavioral:       Please refer to HPI    Medications: I have reviewed the patient's current medications.  Current Outpatient Medications  Medication Sig Dispense Refill  . clonazePAM (KLONOPIN) 0.5 MG tablet Take 1 tablet (0.5 mg total) by mouth 2 (two) times daily as needed. 60 tablet 1  . traZODone (DESYREL) 100 MG tablet TAKE ONE-HALF (1/2) TO ONE TABLET AT BEDTIME FOR INSOMNIA 90 tablet 3  . venlafaxine XR (EFFEXOR-XR) 37.5 MG 24 hr capsule TAKE 1 CAPSULE DAILY WITH BREAKFAST 90 capsule 1  . buPROPion (WELLBUTRIN XL) 150 MG 24 hr tablet Take 1 tablet (150 mg total) by mouth daily. 30 tablet 1  . FLUoxetine (PROZAC) 10 MG capsule TAKE 1 CAPSULE DAILY TAKE WITH 20 MG TO EQUAL 30 MG DAILY 90 capsule 0  . FLUoxetine (PROZAC) 20 MG capsule Take 1 capsule (20 mg) by mouth with 10 mg to equal 30 mg daily 90 capsule 0   No current facility-administered medications for this visit.    Medication Side Effects: None  Allergies:  Allergies  Allergen Reactions  . Iodinated Diagnostic Agents Swelling  . Silver Rash  . Penicillins   . Shellfish Allergy   . Prednisone Anxiety    Past Medical History:  Diagnosis Date  . Anxiety   . Hyperlipidemia   . Kidney stones   . Mood disorder ([place])   . Neck pain  C4-C5    Family History  Problem Relation Age of Onset  . Hyperlipidemia Mother   . Fibromyalgia Mother   . Irritable bowel syndrome Mother   . Arthritis Mother   . Anxiety disorder Mother   . Diabetes Father   . Asthma Son   . Heart disease Maternal Aunt   . Aneurysm Maternal Aunt   . AAA (abdominal aortic aneurysm) Maternal Uncle   . Stroke Maternal Grandfather   . Heart disease Paternal Grandfather   . Bipolar disorder Brother   . Diabetes Brother   . ADD / ADHD Son     Social History   Socioeconomic History  . Marital status: Married    Spouse name: Not on file  . Number of children: 2  . Years of  education: Not on file  . 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 Smoking status: Never Smoker  . Smokeless tobacco: Never Used  Vaping Use  . Vaping Use: Never used  Substance and Sexual Activity  . Alcohol use: Yes    Alcohol/week: 1.0 standard drink    Types: 1 Glasses of wine per week  . Drug use: No  . Sexual activity: Yes    Birth control/protection: Surgical  Other Topics Concern  . Not on file  Social History Narrative   Lives with husband & 2 kids.   Works [employer].   Wears her seatbelt.   Smoke detector in the home.    Social Determinants of Health   Financial Resource Strain:   . Difficulty of Paying Living Expenses: Not on file  Food Insecurity:   . Worried About Charity fundraiser in the Last Year: Not on file  . Ran Out of Food in the Last Year: Not on file  Transportation Needs:   . Lack of Transportation (Medical): Not on file  . Lack of Transportation (Non-Medical): Not on file  Physical Activity:   . Days of Exercise per Week: Not on file  . Minutes of Exercise per Session: Not on file  Stress:   . Feeling of Stress : Not on file  Social Connections:   . Frequency of Communication with Friends and Family: Not on file  . Frequency of Social Gatherings with Friends and Family: Not on file  . Attends Religious Services: Not on file  . Active Member of Clubs or Organizations: Not on file  . Attends Archivist Meetings: Not on file  . Marital Status: Not on file  Intimate Partner Violence:   . Fear of Current or Ex-Partner: Not on file  . Emotionally Abused: Not on file  . Physically Abused: Not on file  . Sexually Abused: Not on file    Past Medical History, Surgical history, Social history, and Family history were reviewed and updated as appropriate.   Please see review of systems for further details on the patient's review from today.   Objective:   Physical Exam:  There were no  vitals taken for this visit.  Physical Exam Constitutional:      General: She is not in acute distress. Musculoskeletal:        General: No deformity.  Neurological:     Mental Status: She is alert and oriented to person, place, and time.     Coordination: Coordination normal.  Psychiatric:        Attention and Perception: Attention and perception normal. She does not perceive auditory  or visual hallucinations.        Mood and Affect: Mood is depressed. Mood is not anxious. Affect is not labile, blunt, angry or inappropriate.        Speech: Speech normal.        Behavior: Behavior normal.        Thought Content: Thought content normal. Thought content is not paranoid or delusional. Thought content does not include homicidal or suicidal ideation. Thought content does not include homicidal or suicidal plan.        Cognition and Memory: Cognition and memory normal.        Judgment: Judgment normal.     Comments: Insight intact     Lab Review:     Component Value Date/Time   NA 137 06/29/2017 1628   K 3.9 06/29/2017 1628   CL 105 06/29/2017 1628   CO2 24 06/29/2017 1628   GLUCOSE 91 06/29/2017 1628   BUN 14 06/29/2017 1628   CREATININE 0.84 06/29/2017 1628   CALCIUM 9.1 06/29/2017 1628   PROT 7.5 06/29/2017 1628   ALBUMIN 4.1 06/29/2017 1628   AST 17 06/29/2017 1628   ALT 15 06/29/2017 1628   ALKPHOS 59 06/29/2017 1628   BILITOT 0.6 06/29/2017 1628   GFRNONAA >60 06/29/2017 1628   GFRAA >60 06/29/2017 1628       Component Value Date/Time   WBC 10.5 06/29/2017 1628   RBC 4.63 06/29/2017 1628   HGB 14.4 06/29/2017 1628   HCT 41.1 06/29/2017 1628   PLT 285 06/29/2017 1628   MCV 88.8 06/29/2017 1628   MCH 31.1 06/29/2017 1628   MCHC 35.0 06/29/2017 1628   RDW 13.1 06/29/2017 1628   LYMPHSABS 1.8 06/29/2017 1628   MONOABS 0.6 06/29/2017 1628   EOSABS 0.3 06/29/2017 1628   BASOSABS 0.0 06/29/2017 1628    No results found for: POCLITH, LITHIUM   No results found  for: PHENYTOIN, PHENOBARB, VALPROATE, CBMZ   .res Assessment: Plan:   Will change Wellbutrin SR to Wellbutrin XL 150 mg daily since patient has had a partial response to lower dose of Wellbutrin.  Discussed that higher dose of Wellbutrin may be helpful for her mood, energy, motivation, and concentration. Continue Prozac 30 mg daily for anxiety and depression. Continue trazodone for insomnia.  Discussed trying to take one half tab instead of 1 tablet at bedtime to possibly minimize excessive daytime somnolence. Discussed that Effexor XR could be discontinued at any time since she is currently on the lowest dose.  Discussed that she may experience some slight discontinuation signs and symptoms in terms of slight headache.  Recommend not discontinuing Effexor XR until response to start of Wellbutrin XL is known. Continue Klonopin as needed for anxiety. Patient to follow-up in 4 weeks or sooner if clinically indicated. Patient advised to contact office with any questions, adverse effects, or acute worsening in signs and symptoms.    Antone[place] was seen today for follow-up.  Diagnoses and all orders for this visit:  Major depressive disorder, recurrent episode, moderate (HCC) -     buPROPion (WELLBUTRIN XL) 150 MG 24 hr tablet; Take 1 tablet (150 mg total) by mouth daily. -     FLUoxetine (PROZAC) 10 MG capsule; TAKE 1 CAPSULE DAILY TAKE WITH 20 MG TO EQUAL 30 MG DAILY -     FLUoxetine (PROZAC) 20 MG capsule; Take 1 capsule (20 mg) by mouth with 10 mg to equal 30 mg daily     Please see After Visit Summary for  patient specific instructions.  Future Appointments  Date Time Provider [place]  10/18/2019 10:00 AM Thayer Headings, PMHNP CP-CP None    No orders of the defined types were placed in this encounter.   -------------------------------

## 2019-10-13 ENCOUNTER — Other Ambulatory Visit: Payer: Self-pay | Admitting: Psychiatry

## 2019-10-13 DIAGNOSIS — F331 Major depressive disorder, recurrent, moderate: Secondary | ICD-10-CM

## 2019-10-18 ENCOUNTER — Other Ambulatory Visit: Payer: Self-pay

## 2019-10-18 ENCOUNTER — Ambulatory Visit (INDEPENDENT_AMBULATORY_CARE_PROVIDER_SITE_OTHER): Payer: BC Managed Care – PPO | Admitting: Psychiatry

## 2019-10-18 ENCOUNTER — Encounter: Payer: Self-pay | Admitting: Psychiatry

## 2019-10-18 DIAGNOSIS — F411 Generalized anxiety disorder: Secondary | ICD-10-CM

## 2019-10-18 DIAGNOSIS — F331 Major depressive disorder, recurrent, moderate: Secondary | ICD-10-CM

## 2019-10-18 MED ORDER — FLUOXETINE HCL 10 MG PO CAPS: ORAL_CAPSULE | ORAL | 0 refills | Status: DC

## 2019-10-18 MED ORDER — CLONAZEPAM 0.5 MG PO TABS: 0.5000 mg | ORAL_TABLET | Freq: Two times a day (BID) | ORAL | 2 refills | Status: DC | PRN

## 2019-10-18 MED ORDER — BUPROPION HCL ER (SR) 100 MG PO TB12: 100.0000 mg | ORAL_TABLET | Freq: Every day | ORAL | 1 refills | Status: DC

## 2019-10-18 MED ORDER — BUPROPION HCL ER (SR) 100 MG PO TB12: 100.0000 mg | ORAL_TABLET | Freq: Every morning | ORAL | 0 refills | Status: DC

## 2019-10-18 MED ORDER — FLUOXETINE HCL 20 MG PO CAPS: ORAL_CAPSULE | ORAL | 0 refills | Status: DC

## 2019-10-18 NOTE — Progress Notes (Signed)
Carmen Robinson [id number] 01/21/1970 49 y.o.  Subjective:   Patient ID:  Carmen Robinson is a 49 y.o. (DOB December 24, 1970) female.  Chief Complaint:  Chief Complaint  Patient presents with  . Depression  . Anxiety    HPI Carmen Robinson presents to the office today for follow-up of depression and anxiety. She stopped Effexor and did not have any difficulty with this.  She was feeling jittery with Wellbutrin XL 150 mg and recently stopped this and went back to Wellbutrin SR 100 mg po qd and she has been less jittery. She reports significant job stress. Reports that she learned after the fact that coworkers in her area had COVD, including a coworker that shares the same work space. She reports that she has occasional irritability and increased frustration. May have had more irritability with Wellbutrin XL 150 mg po qd. Reports that her mood is worse when she is at work and "ok when I am not there." She reports that energy is low at times and occasionally "will have to sleep all day." She reports that everyday after work she comes home and has to lay down and nap for at least an hour. She reports that motivation is low and she has to push herself to do things. She would like to start exercising to help with energy. Sleeping ok. Appetite has been good. Concentration has been adequate. Denies SI.   Past Psychiatric Medication Trials: Klonopin Prozac- Jittery at 40 mg po qd. Some benefit at 30 mg qd.  Effexor XR- Side effects on doses above 75 mg qd Zoloft- "Numb" Wellbutrin- Felt jittery and had possible increased irritability with XL 150 mg qd.  Lamictal-cognitive side effects Adderall Trazodone- Effective Hydroxyzine- prescribed for poison ivy [place]  GAD-7     Office Visit from 12/25/2015 in [place]  Total GAD-7 Score 13    PHQ2-9     Office Visit from 11/11/2016 in [place] Office Visit from 12/25/2015 in [place] Office Visit from 09/05/2015 in [place]  PHQ-2 Total Score 0 4 0  PHQ-9 Total Score 0 13 --       Review of Systems:  Review of Systems  Musculoskeletal: Negative for gait problem.  Skin:       Skin discomfort under breast where she had radiation. Also notices increased scar tissue.   Neurological:       Feels less jittery on lower dose of Wellbutrin  Psychiatric/Behavioral:       Please refer to HPI    Medications: I have reviewed the patient's current medications.  Current Outpatient Medications  Medication Sig Dispense Refill  . buPROPion (WELLBUTRIN SR) 100 MG 12 hr tablet Take 100 mg by mouth daily.    Derrill Memo ON 11/15/2019] clonazePAM (KLONOPIN) 0.5 MG tablet Take 1 tablet (0.5 mg total) by mouth 2 (two) times daily as needed. 60 tablet 2  . cyclobenzaprine (FLEXERIL) 10 MG tablet Take by mouth.    Marland Kitchen FLUoxetine (PROZAC) 10 MG capsule TAKE 1 CAPSULE DAILY TAKE WITH 20 MG TO EQUAL 30 MG DAILY 90 capsule 0  . FLUoxetine (PROZAC) 20 MG capsule Take 1 capsule (20 mg) by mouth with 10 mg to equal 30 mg daily 90 capsule 0  . traZODone (DESYREL) 100 MG tablet TAKE ONE-HALF (1/2) TO ONE TABLET AT BEDTIME FOR INSOMNIA 90 tablet 3  . buPROPion (WELLBUTRIN SR) 100 MG 12  hr tablet Take 1 tablet (100 mg total) by mouth in the morning. 30 tablet 0  . buPROPion (WELLBUTRIN SR) 100 MG 12 hr tablet Take 1 tablet (100 mg total) by mouth daily. 90 tablet 1   No current facility-administered medications for this visit.    Medication Side Effects: None  Allergies:  Allergies  Allergen Reactions  . Iodinated Diagnostic Agents Swelling  . Silver Rash  . Penicillins   . Shellfish Allergy   . Prednisone Anxiety    Past Medical History:  Diagnosis Date  . Anxiety   . Hyperlipidemia   . Kidney stones   . Mood disorder ([place])   . Neck pain    C4-C5    Family History  Problem Relation Age of Onset  . Hyperlipidemia Mother   . Fibromyalgia Mother   .  Irritable bowel syndrome Mother   . Arthritis Mother   . Anxiety disorder Mother   . Diabetes Father   . Asthma Son   . Heart disease Maternal Aunt   . Aneurysm Maternal Aunt   . AAA (abdominal aortic aneurysm) Maternal Uncle   . Stroke Maternal Grandfather   . Heart disease Paternal Grandfather   . Bipolar disorder Brother   . Diabetes Brother   . ADD / ADHD Son     Social History   Socioeconomic History  . Marital status: Married    Spouse name: Not on file  . Number of children: 2  . Years of education: Not on file  . 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 Smoking status: Never Smoker  . Smokeless tobacco: Never Used  Vaping Use  . Vaping Use: Never used  Substance and Sexual Activity  . Alcohol use: Yes    Alcohol/week: 1.0 standard drink    Types: 1 Glasses of wine per week  . Drug use: No  . Sexual activity: Yes    Birth control/protection: Surgical  Other Topics Concern  . Not on file  Social History Narrative   Lives with husband & 2 kids.   Works [employer].   Wears her seatbelt.   Smoke detector in the home.    Social Determinants of Health   Financial Resource Strain:   . Difficulty of Paying Living Expenses: Not on file  Food Insecurity:   . Worried About Charity fundraiser in the Last Year: Not on file  . Ran Out of Food in the Last Year: Not on file  Transportation Needs:   . Lack of Transportation (Medical): Not on file  . Lack of Transportation (Non-Medical): Not on file  Physical Activity:   . Days of Exercise per Week: Not on file  . Minutes of Exercise per Session: Not on file  Stress:   . Feeling of Stress : Not on file  Social Connections:   . Frequency of Communication with Friends and Family: Not on file  . Frequency of Social Gatherings with Friends and Family: Not on file  . Attends Religious Services: Not on file  . Active Member of Clubs or Organizations: Not on file  .  Attends Archivist Meetings: Not on file  . Marital Status: Not on file  Intimate Partner Violence:   . Fear of Current or Ex-Partner: Not on file  . Emotionally Abused: Not on file  . Physically Abused: Not on file  . Sexually Abused: Not on file    Past Medical  History, Surgical history, Social history, and Family history were reviewed and updated as appropriate.   Please see review of systems for further details on the patient's review from today.   Objective:   Physical Exam:  There were no vitals taken for this visit.  Physical Exam Constitutional:      General: She is not in acute distress. Musculoskeletal:        General: No deformity.  Neurological:     Mental Status: She is alert and oriented to person, place, and time.     Coordination: Coordination normal.  Psychiatric:        Attention and Perception: Attention and perception normal. She does not perceive auditory or visual hallucinations.        Mood and Affect: Mood normal. Mood is not anxious or depressed. Affect is not labile, blunt, angry or inappropriate.        Speech: Speech normal.        Behavior: Behavior normal.        Thought Content: Thought content normal. Thought content is not paranoid or delusional. Thought content does not include homicidal or suicidal ideation. Thought content does not include homicidal or suicidal plan.        Cognition and Memory: Cognition and memory normal.        Judgment: Judgment normal.     Comments: Insight intact     Lab Review:     Component Value Date/Time   NA 137 06/29/2017 1628   K 3.9 06/29/2017 1628   CL 105 06/29/2017 1628   CO2 24 06/29/2017 1628   GLUCOSE 91 06/29/2017 1628   BUN 14 06/29/2017 1628   CREATININE 0.84 06/29/2017 1628   CALCIUM 9.1 06/29/2017 1628   PROT 7.5 06/29/2017 1628   ALBUMIN 4.1 06/29/2017 1628   AST 17 06/29/2017 1628   ALT 15 06/29/2017 1628   ALKPHOS 59 06/29/2017 1628   BILITOT 0.6 06/29/2017 1628   GFRNONAA  >60 06/29/2017 1628   GFRAA >60 06/29/2017 1628       Component Value Date/Time   WBC 10.5 06/29/2017 1628   RBC 4.63 06/29/2017 1628   HGB 14.4 06/29/2017 1628   HCT 41.1 06/29/2017 1628   PLT 285 06/29/2017 1628   MCV 88.8 06/29/2017 1628   MCH 31.1 06/29/2017 1628   MCHC 35.0 06/29/2017 1628   RDW 13.1 06/29/2017 1628   LYMPHSABS 1.8 06/29/2017 1628   MONOABS 0.6 06/29/2017 1628   EOSABS 0.3 06/29/2017 1628   BASOSABS 0.0 06/29/2017 1628    No results found for: POCLITH, LITHIUM   No results found for: PHENYTOIN, PHENOBARB, VALPROATE, CBMZ   .res Assessment: Plan:   Will change Wellbutrin XL 150 mg po q am to Wellbutrin SR 100 mg po q am due to increased jitteriness and possible increased irritability.  Continue Prozac 30 mg daily for anxiety and depression. Continue Klonopin 0.5 mg twice daily as needed for anxiety. Continue trazodone for insomnia. Patient to follow-up in 3 months or sooner if clinically indicated. Patient advised to contact office with any questions, adverse effects, or acute worsening in signs and symptoms.  Carmen Robinson was seen today for depression and anxiety.  Diagnoses and all orders for this visit:  Major depressive disorder, recurrent episode, moderate (HCC) -     buPROPion (WELLBUTRIN SR) 100 MG 12 hr tablet; Take 1 tablet (100 mg total) by mouth in the morning. -     FLUoxetine (PROZAC) 10 MG capsule; TAKE 1 CAPSULE DAILY TAKE  WITH 20 MG TO EQUAL 30 MG DAILY -     FLUoxetine (PROZAC) 20 MG capsule; Take 1 capsule (20 mg) by mouth with 10 mg to equal 30 mg daily -     buPROPion (WELLBUTRIN SR) 100 MG 12 hr tablet; Take 1 tablet (100 mg total) by mouth daily.  Generalized anxiety disorder -     FLUoxetine (PROZAC) 10 MG capsule; TAKE 1 CAPSULE DAILY TAKE WITH 20 MG TO EQUAL 30 MG DAILY -     FLUoxetine (PROZAC) 20 MG capsule; Take 1 capsule (20 mg) by mouth with 10 mg to equal 30 mg daily -     clonazePAM (KLONOPIN) 0.5 MG tablet; Take 1 tablet  (0.5 mg total) by mouth 2 (two) times daily as needed.     Please see After Visit Summary for patient specific instructions.  Future Appointments  Date Time Provider [place]  02/21/2020 10:00 AM Thayer Headings, PMHNP [facility] None    No orders of the defined types were placed in this encounter.   -------------------------------

## 2019-10-21 ENCOUNTER — Telehealth: Payer: Self-pay | Admitting: Psychiatry

## 2019-10-21 DIAGNOSIS — F331 Major depressive disorder, recurrent, moderate: Secondary | ICD-10-CM

## 2019-10-21 MED ORDER — BUPROPION HCL ER (XL) 150 MG PO TB24: 150.0000 mg | ORAL_TABLET | Freq: Every day | ORAL | 0 refills | Status: DC

## 2019-10-21 NOTE — Telephone Encounter (Signed)
Pt called regarding her Wellbutrin. She is on 150 mg but is sleepy and depressed and is asking if she can go back down to 100 mg? She missed two days of work due to being so sleepy and tired. [phone]

## 2019-10-21 NOTE — Telephone Encounter (Signed)
Changed to Wellbutrin XL 150 mg last visit now asking to go back to Wellbutrin SR 100 mg

## 2019-10-21 NOTE — Addendum Note (Signed)
Addended by: Sharyl Nimrod on: 10/21/2019 03:17 PM   Modules accepted: Orders

## 2019-10-21 NOTE — Telephone Encounter (Signed)
Goodness that was confusing. So yes she reports she wants to retry the Wellbutrin XL 150 mg. She has about 4 tablets left. She took 1 this morning instead of the SR 100 mg. She said she will try to cut out her morning coffee (caffeine) and make sure she's eating in the morning when she takes it. Informed her we would send a new Rx for the Wellbutrin XL 150 mg today. She will call back if this doesn't work again.

## 2019-10-21 NOTE — Telephone Encounter (Signed)
Script sent for Wellbutrin XL 150 mg qd

## 2019-12-20 ENCOUNTER — Other Ambulatory Visit: Payer: Self-pay | Admitting: Psychiatry

## 2019-12-20 DIAGNOSIS — F331 Major depressive disorder, recurrent, moderate: Secondary | ICD-10-CM

## 2020-01-28 ENCOUNTER — Other Ambulatory Visit: Payer: Self-pay | Admitting: Psychiatry

## 2020-01-28 DIAGNOSIS — F331 Major depressive disorder, recurrent, moderate: Secondary | ICD-10-CM

## 2020-02-08 IMAGING — CT CT RENAL STONE PROTOCOL
2 of 4 series · 16 of 46 positions shown, 18 images · non-contrast
Comparison: CT the abdomen and pelvis 05/30/2010.

CLINICAL DATA: 47-year-old female with history of right-sided flank
pain and pelvic pain. Urinary tract infection 2 weeks ago on
antibiotics.

EXAM:
CT ABDOMEN AND PELVIS WITHOUT CONTRAST
TECHNIQUE: Multidetector CT imaging of the abdomen and pelvis was performed
following the standard protocol without IV contrast.

[Series 2: axial st · axial · 0.80mm/px · z∈[-485,-60]mm · 13 of 93 slices shown, 15 images]
[im 4/93  soft-tissue]
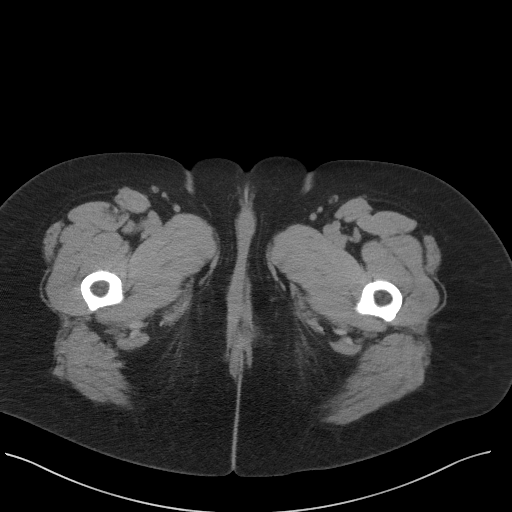
[im 4/93  bone]
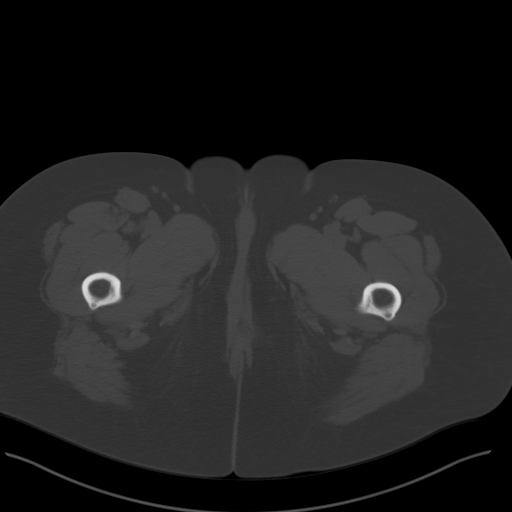
[im 12/93  soft-tissue]
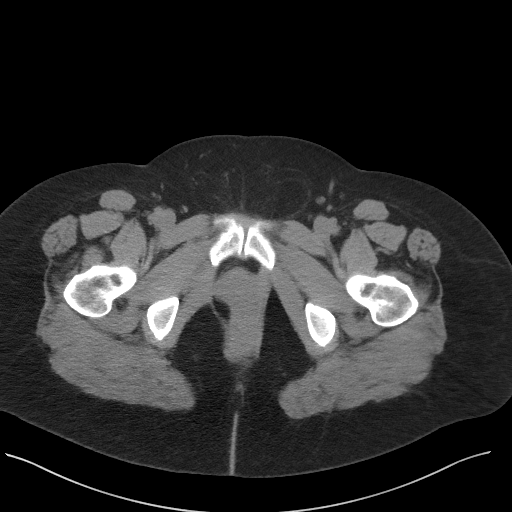
[im 19/93  soft-tissue]
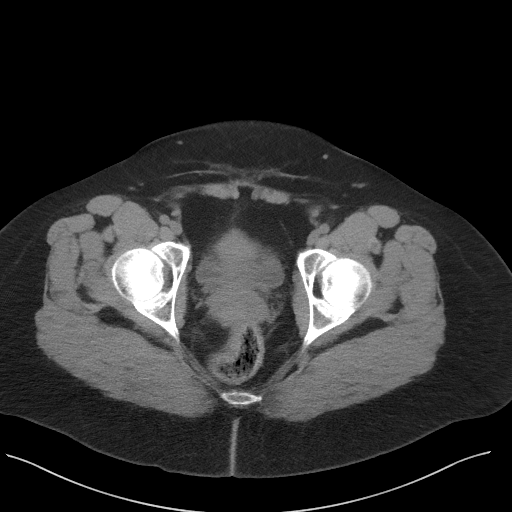
[im 26/93  soft-tissue]
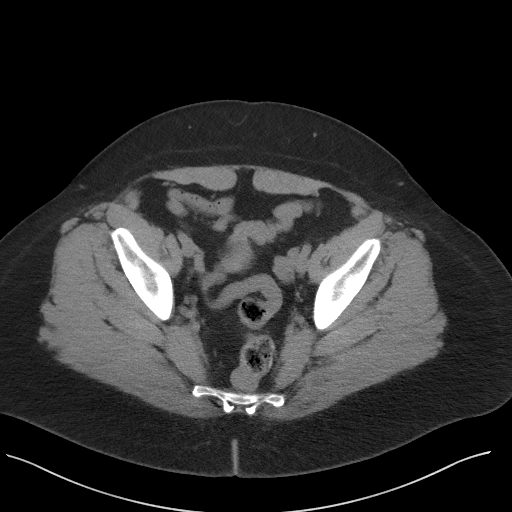
[im 34/93  soft-tissue]
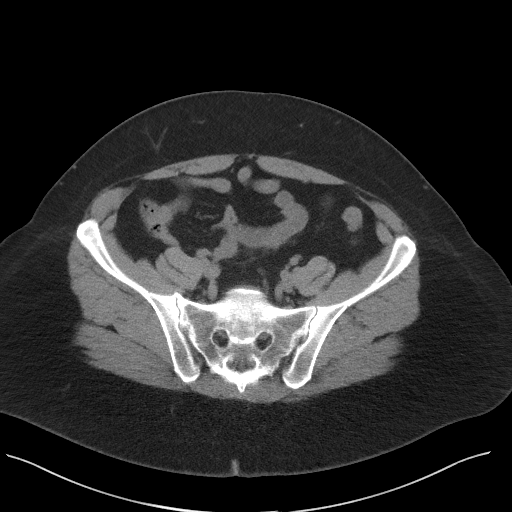
[im 41/93  soft-tissue]
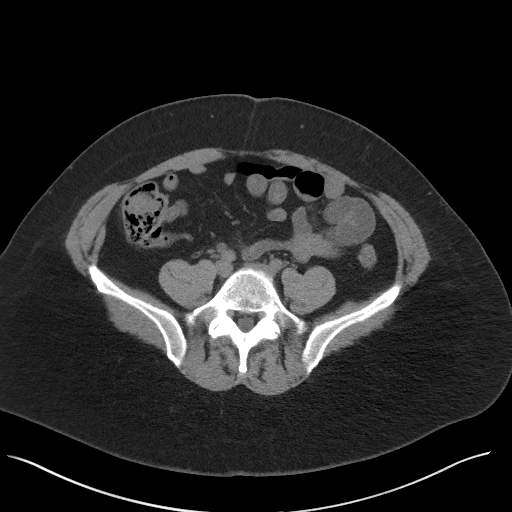
[im 48/93  soft-tissue]
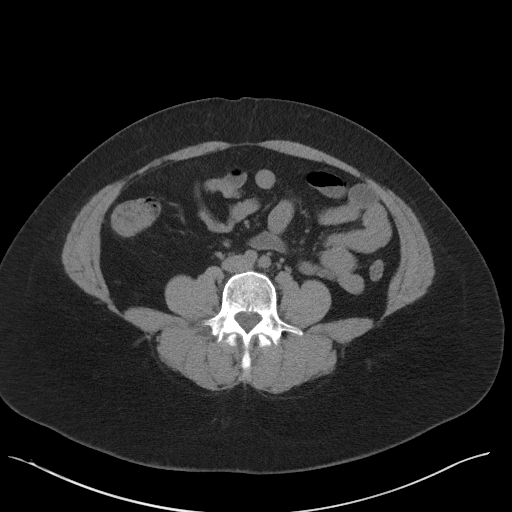
[im 52/93  soft-tissue]
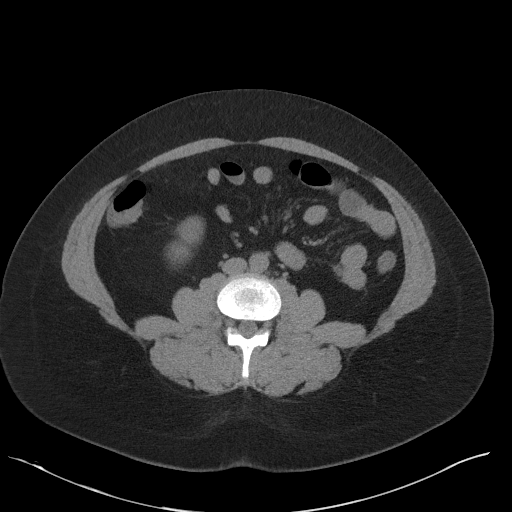
[im 59/93  soft-tissue]
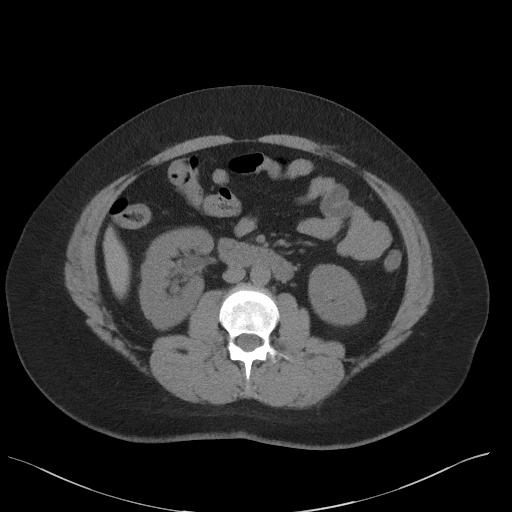
[im 59/93  bone]
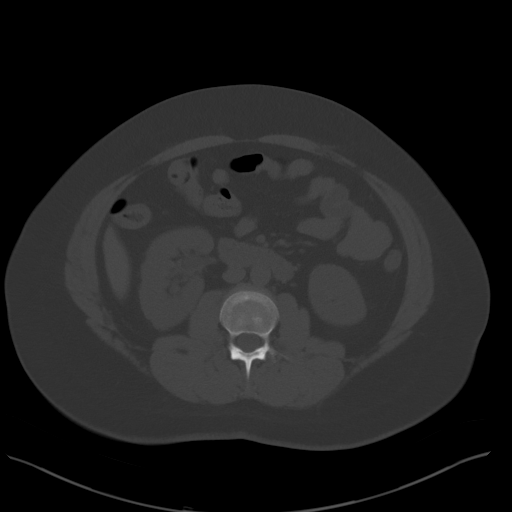
[im 67/93  soft-tissue]
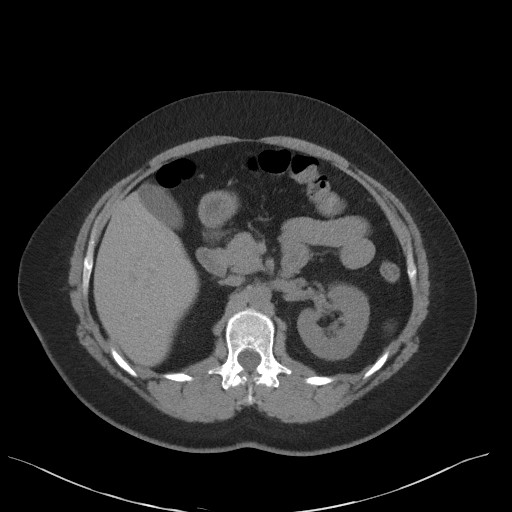
[im 74/93  soft-tissue]
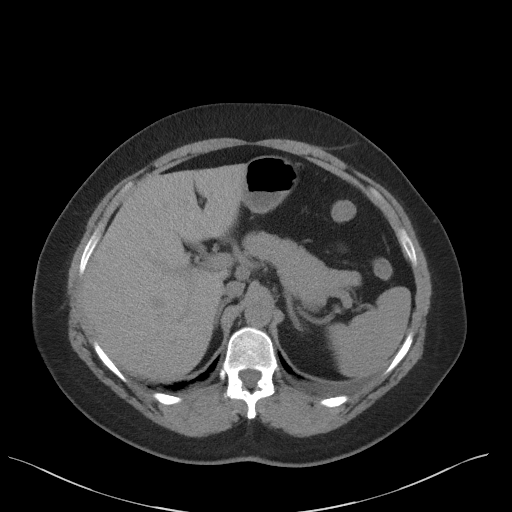
[im 81/93  soft-tissue]
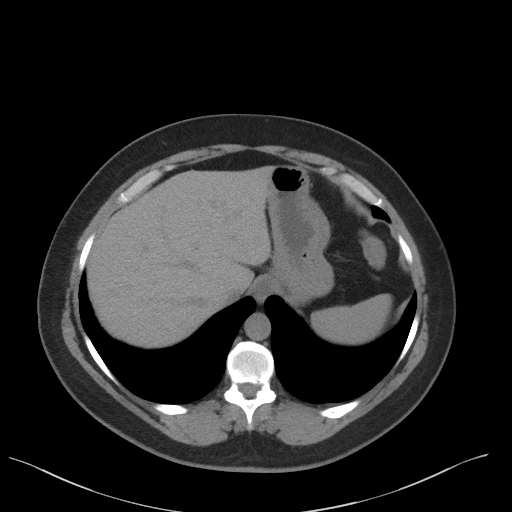
[im 89/93  soft-tissue]
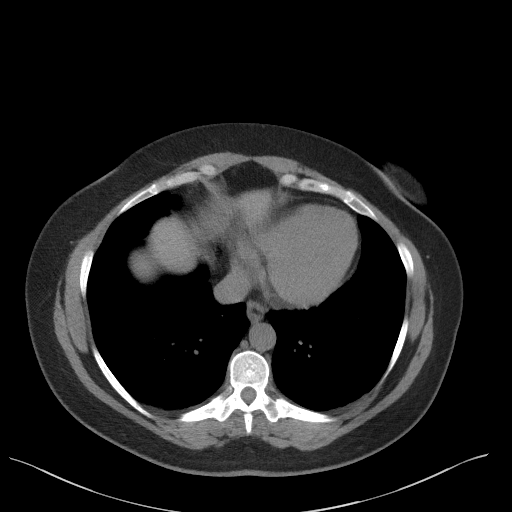

[Series 5: coronal st · coronal · 0.87mm/px · 3 of 102 slices shown]
[im 34/102  soft-tissue]
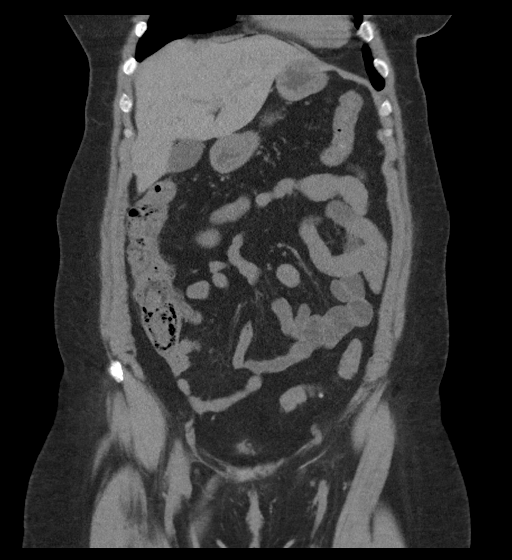
[im 45/102  soft-tissue]
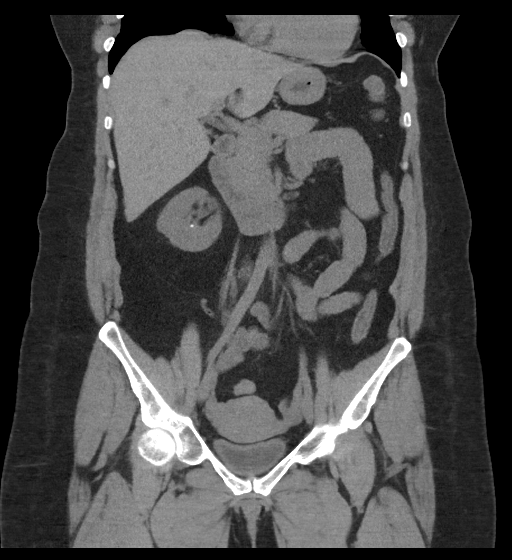
[im 57/102  soft-tissue]
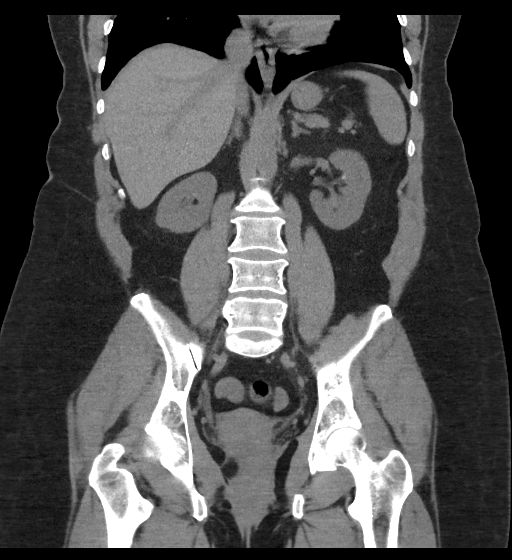

[16 of 46 positions shown; findings below may reference images not displayed]

FINDINGS: Lower chest: Unremarkable.

Hepatobiliary: No suspicious cystic or solid hepatic lesions are
confidently identified on today's noncontrast CT examination.
Unenhanced appearance of the gallbladder is normal.

Pancreas: No definite pancreatic mass or peripancreatic fluid or
inflammatory changes are noted on today's noncontrast CT
examination.

Spleen: Unremarkable.

Adrenals/Urinary Tract: Multiple nonobstructive calculi are noted
within the collecting systems of both kidneys (right greater than
left) measuring up to 3 mm in the lower pole collecting system of
the right kidney. In addition, in the right side of the urinary
bladder (axial image 77 of series 2), at or immediately beyond the
right ureterovesicular junction measuring 2 mm. No additional
calculi are noted along the course of the left ureter or elsewhere
in the urinary bladder. There is no associated proximal
hydroureteronephrosis on either side. Unenhanced appearance of the
kidneys and bilateral adrenal glands is otherwise unremarkable.
Urinary bladder is normal in appearance.

Stomach/Bowel: Unenhanced appearance of the stomach is normal. There
is no pathologic dilatation of small bowel or colon. Several colonic
diverticulae are noted, without surrounding inflammatory changes to
suggest an acute diverticulitis at this time. Normal appendix.

Vascular/Lymphatic: No atherosclerotic calcifications are noted in
the abdominal aorta. No lymphadenopathy noted in the abdomen or
pelvis.

Reproductive: Uterus and ovaries are unremarkable in appearance.

Other: Small left inguinal hernia containing only fat. No
significant volume of ascites. No pneumoperitoneum.

Musculoskeletal: There are no aggressive appearing lytic or blastic
lesions noted in the visualized portions of the skeleton.
IMPRESSION: 1. 2 mm calculus in the right side of the urinary bladder at or
immediately beyond the right ureterovesicular junction. This is not
associated with proximal right hydroureteronephrosis to indicate
urinary tract obstruction at this time.
2. Multiple additional nonobstructive calculi in the collecting
systems of both kidneys (right greater than left), measuring up to 3
mm on the right side.
3. Colonic diverticulosis without evidence of acute diverticulitis
at this time.
4. Small left inguinal hernia containing only fat.

## 2020-02-21 ENCOUNTER — Ambulatory Visit: Payer: BC Managed Care – PPO | Admitting: Psychiatry

## 2020-02-25 ENCOUNTER — Other Ambulatory Visit: Payer: Self-pay | Admitting: Psychiatry

## 2020-02-25 DIAGNOSIS — F331 Major depressive disorder, recurrent, moderate: Secondary | ICD-10-CM

## 2020-02-28 ENCOUNTER — Other Ambulatory Visit: Payer: Self-pay | Admitting: Psychiatry

## 2020-02-28 DIAGNOSIS — F411 Generalized anxiety disorder: Secondary | ICD-10-CM

## 2020-02-28 DIAGNOSIS — F331 Major depressive disorder, recurrent, moderate: Secondary | ICD-10-CM

## 2020-05-27 ENCOUNTER — Other Ambulatory Visit: Payer: Self-pay | Admitting: Psychiatry

## 2020-05-27 DIAGNOSIS — F331 Major depressive disorder, recurrent, moderate: Secondary | ICD-10-CM

## 2020-05-29 ENCOUNTER — Other Ambulatory Visit: Payer: Self-pay | Admitting: Psychiatry

## 2020-05-29 DIAGNOSIS — F331 Major depressive disorder, recurrent, moderate: Secondary | ICD-10-CM

## 2020-05-29 DIAGNOSIS — F411 Generalized anxiety disorder: Secondary | ICD-10-CM

## 2020-06-12 ENCOUNTER — Other Ambulatory Visit: Payer: Self-pay

## 2020-06-12 ENCOUNTER — Encounter: Payer: Self-pay | Admitting: Psychiatry

## 2020-06-12 ENCOUNTER — Ambulatory Visit (INDEPENDENT_AMBULATORY_CARE_PROVIDER_SITE_OTHER): Payer: BC Managed Care – PPO | Admitting: Psychiatry

## 2020-06-12 DIAGNOSIS — F331 Major depressive disorder, recurrent, moderate: Secondary | ICD-10-CM

## 2020-06-12 DIAGNOSIS — F411 Generalized anxiety disorder: Secondary | ICD-10-CM

## 2020-06-12 MED ORDER — FLUOXETINE HCL 20 MG PO CAPS: ORAL_CAPSULE | ORAL | 1 refills | Status: DC

## 2020-06-12 MED ORDER — FLUOXETINE HCL 10 MG PO CAPS: ORAL_CAPSULE | ORAL | 1 refills | Status: DC

## 2020-06-12 MED ORDER — BUPROPION HCL ER (XL) 150 MG PO TB24: 1.0000 | ORAL_TABLET | Freq: Every day | ORAL | 1 refills | Status: DC

## 2020-06-12 MED ORDER — CLONAZEPAM 0.5 MG PO TABS: 0.5000 mg | ORAL_TABLET | Freq: Two times a day (BID) | ORAL | 2 refills | Status: DC | PRN

## 2020-06-12 NOTE — Progress Notes (Signed)
Carmen Robinson [id number] Jan 13, 1970 50 y.o.  Subjective:   Patient ID:  Carmen Robinson is a 50 y.o. (DOB 13-Nov-1970) female.  Chief Complaint:  Chief Complaint  Patient presents with  . Anxiety  . Depression    HPI Carmen Robinson presents to the office today for follow-up of depression and anxiety. She reports that she continues to have significant work stress with new system, metrics, multiple changes, etc. She reports that ADA renewal is approaching. Current accommodations cover 5 absences per month. She reports that she continues to need accommodations for 3 absences a month due to fatigue and anxiety. She reports that she continues to have severe fatigue at the end of the week since chemotherapy. She reports that she is on a written warning for attendance and can lose her job if she gets written up for anything else. Reports on a few occasions she was a minute late. She reports that she was 30 minutes late one day due to her car battery dying. She reports that car battery died again and caused another late occurrence that resulted in her getting written up. She reports that she has anxiety about being late. She reports that she also has anxiety throughout her work day. Denies panic attacks. She reports that work stress also contributes to depressed mood. She reports motivation is ok. She reports adequate sleep. Appetite has been ok. She has been going to the gym some. She reports gaining 50 lbs since menopause and chemotherapy. Concentration has been ok. Denies SI.   Oldest son is doing well and is home from college for the summer. Youngest son just graduated from [employer] and will be going to [employer].   She reports that she ran out of Klonopin and has not taken it recently. Last filled 08/25/19.  Past Psychiatric Medication Trials: Klonopin Prozac- Jittery at 40 mg po qd. Some benefit at 30 mg qd.  Effexor XR- Side effects on doses above 75 mg qd Zoloft- "Numb" Wellbutrin- Felt jittery and  had possible increased irritability with XL 150 mg qd.  Lamictal-cognitive side effects Adderall Trazodone- Effective Hydroxyzine- prescribed for poison ivy [place]  GAD-7   Flowsheet Row Office Visit from 12/25/2015 in [place]  Total GAD-7 Score 13    PHQ2-9   [place] Office Visit from 11/11/2016 in [place] Office Visit from 12/25/2015 in [place] Office Visit from 09/05/2015 in [place]  PHQ-2 Total Score 0 4 0  PHQ-9 Total Score 0 13 --       Review of Systems:  Review of Systems  Constitutional: Positive for fatigue.  Musculoskeletal: Positive for back pain. Negative for gait problem.  Psychiatric/Behavioral:       Please refer to HPI    Medications: I have reviewed the patient's current medications.  Current Outpatient Medications  Medication Sig Dispense Refill  . traZODone (DESYREL) 100 MG tablet TAKE ONE-HALF (1/2) TO ONE TABLET AT BEDTIME FOR INSOMNIA 90 tablet 3  . buPROPion (WELLBUTRIN XL) 150 MG 24 hr tablet Take 1 tablet (150 mg total) by mouth daily. 90 tablet 1  . clonazePAM (KLONOPIN) 0.5 MG tablet Take 1 tablet (0.5 mg total) by mouth 2 (two) times daily as needed. 60 tablet 2  . cyclobenzaprine (FLEXERIL) 10 MG tablet Take by mouth. (Patient not taking: Reported on 06/12/2020)    . FLUoxetine (PROZAC) 10 MG capsule TAKE 1 CAPSULE DAILY, TAKE  WITH 20 MG TO EQUAL 30 MG DAILY 90 capsule 1  . FLUoxetine (PROZAC) 20 MG capsule TAKE 1 CAPSULE WITH 10 MG TO EQUAL 30 MG DAILY 90 capsule 1   No current facility-administered medications for this visit.    Medication Side Effects: None  Allergies:  Allergies  Allergen Reactions  . Iodinated Diagnostic Agents Swelling  . Silver Rash  . Penicillins   . Shellfish Allergy   . Prednisone Anxiety    Past Medical History:  Diagnosis Date  . Anxiety   . Hyperlipidemia   . Kidney stones   . Mood disorder ([place])    . Neck pain    C4-C5    Past Medical History, Surgical history, Social history, and Family history were reviewed and updated as appropriate.   Please see review of systems for further details on the patient's review from today.   Objective:   Physical Exam:  There were no vitals taken for this visit.  Physical Exam Constitutional:      General: She is not in acute distress. Musculoskeletal:        General: No deformity.  Neurological:     Mental Status: She is alert and oriented to person, place, and time.     Coordination: Coordination normal.  Psychiatric:        Attention and Perception: Attention and perception normal. She does not perceive auditory or visual hallucinations.        Mood and Affect: Mood is anxious. Affect is not labile, blunt, angry or inappropriate.        Speech: Speech normal.        Behavior: Behavior normal.        Thought Content: Thought content normal. Thought content is not paranoid or delusional. Thought content does not include homicidal or suicidal ideation. Thought content does not include homicidal or suicidal plan.        Cognition and Memory: Cognition and memory normal.        Judgment: Judgment normal.     Comments: Insight intact Dysthymic mood     Lab Review:     Component Value Date/Time   NA 137 06/29/2017 1628   K 3.9 06/29/2017 1628   CL 105 06/29/2017 1628   CO2 24 06/29/2017 1628   GLUCOSE 91 06/29/2017 1628   BUN 14 06/29/2017 1628   CREATININE 0.84 06/29/2017 1628   CALCIUM 9.1 06/29/2017 1628   PROT 7.5 06/29/2017 1628   ALBUMIN 4.1 06/29/2017 1628   AST 17 06/29/2017 1628   ALT 15 06/29/2017 1628   ALKPHOS 59 06/29/2017 1628   BILITOT 0.6 06/29/2017 1628   GFRNONAA >60 06/29/2017 1628   GFRAA >60 06/29/2017 1628       Component Value Date/Time   WBC 10.5 06/29/2017 1628   RBC 4.63 06/29/2017 1628   HGB 14.4 06/29/2017 1628   HCT 41.1 06/29/2017 1628   PLT 285 06/29/2017 1628   MCV 88.8 06/29/2017 1628    MCH 31.1 06/29/2017 1628   MCHC 35.0 06/29/2017 1628   RDW 13.1 06/29/2017 1628   LYMPHSABS 1.8 06/29/2017 1628   MONOABS 0.6 06/29/2017 1628   EOSABS 0.3 06/29/2017 1628   BASOSABS 0.0 06/29/2017 1628    No results found for: POCLITH, LITHIUM   No results found for: PHENYTOIN, PHENOBARB, VALPROATE, CBMZ   .res Assessment: Plan:   Pt seen for 30 minutes and time spent discussing accommodations since renewal date is approaching. Recommend that she be allowed 3 absences a month  due to anxiety and fatigue.  She was able to stop Effexor XR.  Continue Wellbutrin XL 150 mg po qd for depression.  Continue Prozac 30 mg po qd for depression and anxiety.  Continue Trazodone for insomnia.  Will re-start Klonopin 0.5 mg po BID prn anxiety.  Pt to follow-up in 6 months or sooner if clinically indicated.  Patient advised to contact office with any questions, adverse effects, or acute worsening in signs and symptoms.  Carmen Robinson was seen today for anxiety and depression.  Diagnoses and all orders for this visit:  Major depressive disorder, recurrent episode, moderate (HCC) -     buPROPion (WELLBUTRIN XL) 150 MG 24 hr tablet; Take 1 tablet (150 mg total) by mouth daily. -     FLUoxetine (PROZAC) 10 MG capsule; TAKE 1 CAPSULE DAILY, TAKE WITH 20 MG TO EQUAL 30 MG DAILY -     FLUoxetine (PROZAC) 20 MG capsule; TAKE 1 CAPSULE WITH 10 MG TO EQUAL 30 MG DAILY  Generalized anxiety disorder -     clonazePAM (KLONOPIN) 0.5 MG tablet; Take 1 tablet (0.5 mg total) by mouth 2 (two) times daily as needed. -     FLUoxetine (PROZAC) 10 MG capsule; TAKE 1 CAPSULE DAILY, TAKE WITH 20 MG TO EQUAL 30 MG DAILY -     FLUoxetine (PROZAC) 20 MG capsule; TAKE 1 CAPSULE WITH 10 MG TO EQUAL 30 MG DAILY     Please see After Visit Summary for patient specific instructions.  No future appointments.  No orders of the defined types were placed in this encounter.   -------------------------------

## 2020-06-26 ENCOUNTER — Telehealth: Payer: Self-pay | Admitting: Psychiatry

## 2020-06-26 DIAGNOSIS — F331 Major depressive disorder, recurrent, moderate: Secondary | ICD-10-CM

## 2020-06-27 DIAGNOSIS — Z0289 Encounter for other administrative examinations: Secondary | ICD-10-CM

## 2020-06-28 ENCOUNTER — Other Ambulatory Visit: Payer: Self-pay | Admitting: Psychiatry

## 2020-06-28 DIAGNOSIS — F331 Major depressive disorder, recurrent, moderate: Secondary | ICD-10-CM

## 2020-06-29 ENCOUNTER — Telehealth: Payer: Self-pay | Admitting: Psychiatry

## 2020-06-29 NOTE — Telephone Encounter (Signed)
Pt notified Employee Accommodation paperwork faxed today.

## 2020-07-03 NOTE — Telephone Encounter (Signed)
Pt was sent a 90 day rx on 6/9 and has a refill remaining she should not be out please let her know

## 2020-07-03 NOTE — Telephone Encounter (Signed)
Pt called in for refill for Wellbutrin 150mg . Appt 12/5. Ph: [phone]. Pharmacy [facility] [address] [place], [place]

## 2020-08-16 ENCOUNTER — Telehealth: Payer: Self-pay | Admitting: Psychiatry

## 2020-08-16 NOTE — Telephone Encounter (Signed)
Received FMLA form. Placed on Traci's desk 8/10

## 2020-08-25 NOTE — Telephone Encounter (Signed)
Noted  

## 2020-08-25 NOTE — Telephone Encounter (Signed)
This was completed and Dr.Cottle signed form while Janett Billow out of office, then faxed per request.

## 2020-08-28 ENCOUNTER — Telehealth: Payer: Self-pay | Admitting: Psychiatry

## 2020-08-28 NOTE — Telephone Encounter (Signed)
Receieved fax from [place] regarding Elexcia Zettlemoyer. Placed form on Traci's desk to complete.

## 2020-08-29 NOTE — Telephone Encounter (Signed)
Patient called today stating the form that was faxed is in need of modification. She stated on question # 8 the duration and frequency need to be more detailed then faxed again.

## 2020-08-29 NOTE — Telephone Encounter (Signed)
Please review

## 2020-08-30 NOTE — Telephone Encounter (Signed)
Noted will update information and refax.

## 2020-12-11 ENCOUNTER — Ambulatory Visit: Payer: BC Managed Care – PPO | Admitting: Psychiatry

## 2020-12-12 ENCOUNTER — Other Ambulatory Visit: Payer: Self-pay | Admitting: Psychiatry

## 2020-12-12 DIAGNOSIS — F331 Major depressive disorder, recurrent, moderate: Secondary | ICD-10-CM

## 2021-02-05 ENCOUNTER — Other Ambulatory Visit: Payer: Self-pay

## 2021-02-05 ENCOUNTER — Telehealth: Payer: Self-pay | Admitting: Psychiatry

## 2021-02-05 DIAGNOSIS — F331 Major depressive disorder, recurrent, moderate: Secondary | ICD-10-CM

## 2021-02-05 DIAGNOSIS — F411 Generalized anxiety disorder: Secondary | ICD-10-CM

## 2021-02-05 MED ORDER — FLUOXETINE HCL 20 MG PO CAPS: ORAL_CAPSULE | ORAL | 0 refills | Status: DC

## 2021-02-05 MED ORDER — FLUOXETINE HCL 10 MG PO CAPS: ORAL_CAPSULE | ORAL | 0 refills | Status: DC

## 2021-02-05 MED ORDER — TRAZODONE HCL 100 MG PO TABS: ORAL_TABLET | ORAL | 0 refills | Status: DC

## 2021-02-05 NOTE — Telephone Encounter (Signed)
Pt called and advised she has a new pharmacy [facility]  [phone] Customer care   [phone]  She wants refills of Trazadone, and Fluoxetine 10mg  and Fluoxetine 20mg .  RX ID      [id number] RX PCN   ADD RXBIN     [id number]  [id number]  No upcoming appt scheduled.

## 2021-02-05 NOTE — Telephone Encounter (Signed)
Rx sent 

## 2021-02-22 ENCOUNTER — Other Ambulatory Visit: Payer: Self-pay

## 2021-02-22 ENCOUNTER — Telehealth: Payer: Self-pay | Admitting: Psychiatry

## 2021-02-22 DIAGNOSIS — F331 Major depressive disorder, recurrent, moderate: Secondary | ICD-10-CM

## 2021-02-22 MED ORDER — TRAZODONE HCL 100 MG PO TABS: ORAL_TABLET | ORAL | 0 refills | Status: DC

## 2021-02-22 NOTE — Telephone Encounter (Signed)
Pharmacy called and said that the script for trazadone got cancelled by mistake. Please resend to them again

## 2021-02-22 NOTE — Telephone Encounter (Signed)
Rx sent 

## 2021-03-22 ENCOUNTER — Telehealth: Payer: Self-pay | Admitting: Psychiatry

## 2021-03-22 ENCOUNTER — Other Ambulatory Visit: Payer: Self-pay

## 2021-03-22 DIAGNOSIS — F331 Major depressive disorder, recurrent, moderate: Secondary | ICD-10-CM

## 2021-03-22 MED ORDER — BUPROPION HCL ER (XL) 150 MG PO TB24: 150.0000 mg | ORAL_TABLET | Freq: Every day | ORAL | 0 refills | Status: DC

## 2021-03-22 NOTE — Telephone Encounter (Signed)
Rx sent 

## 2021-03-22 NOTE — Telephone Encounter (Signed)
Next visit is 04/05/21. Carmen Robinson called requesting a refill on her Wellbutrin XL 150 mg called to: ? ?[id number] [place], [place] - [address] ? ?Phone:  [phone] ?Fax:  [phone] ? ? ? ? ? ?

## 2021-04-05 ENCOUNTER — Telehealth: Payer: Self-pay | Admitting: Psychiatry

## 2021-04-05 ENCOUNTER — Ambulatory Visit: Payer: BC Managed Care – PPO | Admitting: Psychiatry

## 2021-04-05 NOTE — Telephone Encounter (Signed)
Pt called reporting Bebe Liter is requesting update on forms previously completed. Stated all that needs to change is question #1. Sign Date and Fax to [place].  Pt contact # [phone] with any questions. Per Pt  we should have form from before. ?

## 2021-04-10 ENCOUNTER — Ambulatory Visit (INDEPENDENT_AMBULATORY_CARE_PROVIDER_SITE_OTHER): Payer: BC Managed Care – PPO | Admitting: Psychiatry

## 2021-04-10 ENCOUNTER — Encounter: Payer: Self-pay | Admitting: Psychiatry

## 2021-04-10 DIAGNOSIS — F411 Generalized anxiety disorder: Secondary | ICD-10-CM | POA: Diagnosis not present

## 2021-04-10 DIAGNOSIS — F331 Major depressive disorder, recurrent, moderate: Secondary | ICD-10-CM

## 2021-04-10 MED ORDER — FLUOXETINE HCL 20 MG PO CAPS: ORAL_CAPSULE | ORAL | 1 refills | Status: DC

## 2021-04-10 MED ORDER — BUPROPION HCL ER (XL) 150 MG PO TB24: 150.0000 mg | ORAL_TABLET | Freq: Every day | ORAL | 1 refills | Status: DC

## 2021-04-10 MED ORDER — CLONAZEPAM 0.5 MG PO TABS: 0.5000 mg | ORAL_TABLET | Freq: Two times a day (BID) | ORAL | 2 refills | Status: DC | PRN

## 2021-04-10 MED ORDER — TRAZODONE HCL 100 MG PO TABS: ORAL_TABLET | ORAL | 1 refills | Status: DC

## 2021-04-10 MED ORDER — FLUOXETINE HCL 10 MG PO CAPS: ORAL_CAPSULE | ORAL | 1 refills | Status: DC

## 2021-04-10 NOTE — Progress Notes (Signed)
Carmen Robinson ?[id number] ?14-Feb-1970 ?51 y.o. ? ?Subjective:  ? ?Patient ID:  Carmen Robinson is a 51 y.o. (DOB May 04, 1970) female. ? ?Chief Complaint:  ?Chief Complaint  ?Patient presents with  ? Follow-up  ?  Depression and anxiety  ? ? ?HPI ?Carmen Robinson presents to the office today for follow-up of anxiety and depression. She reports that she continue to have high anxiety. She reports financial stressors and the her partner lost his job and she is now having increased financial burden. She reports that she has periods of fatigue from stressors. She reports that she will occasionally sleep excessively.  Sleeps ok otherwise. She reports depressed mood- "it's just the situation." Motivation has been lower and reports that she has to push herself. She reports that she has increased difficulty with concentration during times of increased stress. Appetite varies. Denies SI.  ? ?She reports that her work would not let leave on Friday to come to apt.  ? ?Father had a CVA 06/29/20 and was hospitalized for 45 days and then went to SNF, developed aspiration pneumonia, and was re-hospitalized. He had to be re-hospitalized for a UTI. Mother had an abscess in her abdomen and also required hospitalization last year. She reports that her mother had another surgery 2 weeks ago to reverse ostomy and is back home. She reports both parents were in critical condition at different times.  ? ?Past Psychiatric Medication Trials: ?Klonopin ?Prozac- Jittery at 40 mg po qd. Some benefit at 30 mg qd.  ?Effexor XR- Side effects on doses above 75 mg qd ?Zoloft- "Numb" ?Wellbutrin- Felt jittery and had possible increased irritability with XL 150 mg qd.  ?Lamictal-cognitive side effects ?Adderall ?Trazodone- Effective ?Hydroxyzine- prescribed for poison ivy ?Deplin ?GAD-7   ? ?[place] Office Visit from 12/25/2015 in [place]  ?Total GAD-7 Score 13  ? ?  ? ?PHQ2-9   ? ?[place] Office Visit from  11/11/2016 in [place] Office Visit from 12/25/2015 in [place] Office Visit from 09/05/2015 in [place]  ?PHQ-2 Total Score 0 4 0  ?PHQ-9 Total Score 0 13 --  ? ?  ?  ? ?Review of Systems:  ?Review of Systems  ?Musculoskeletal:  Negative for gait problem.  ?Neurological:  Negative for tremors and headaches.  ?Psychiatric/Behavioral:    ?     Please refer to HPI  ? ?Medications: I have reviewed the patient's current medications. ? ?Current Outpatient Medications  ?Medication Sig Dispense Refill  ? buPROPion (WELLBUTRIN XL) 150 MG 24 hr tablet Take 1 tablet (150 mg total) by mouth daily. 90 tablet 1  ? clonazePAM (KLONOPIN) 0.5 MG tablet Take 1 tablet (0.5 mg total) by mouth 2 (two) times daily as needed. 60 tablet 2  ? FLUoxetine (PROZAC) 10 MG capsule TAKE 1 CAPSULE DAILY, TAKE WITH 20 MG TO EQUAL 30 MG DAILY 90 capsule 1  ? FLUoxetine (PROZAC) 20 MG capsule TAKE 1 CAPSULE WITH 10 MG TO EQUAL 30 MG DAILY 90 capsule 1  ? traZODone (DESYREL) 100 MG tablet TAKE ONE-HALF (1/2) TO ONE TABLET AT BEDTIME FOR INSOMNIA 90 tablet 1  ? ?No current facility-administered medications for this visit.  ? ? ?Medication Side Effects: None ? ?Allergies:  ?Allergies  ?Allergen Reactions  ? Iodinated Contrast Media Swelling  ? Silver Rash  ? Penicillins   ? Shellfish Allergy   ? Prednisone Anxiety  ? ? ?  Past Medical History:  ?Diagnosis Date  ? Anxiety   ? Hyperlipidemia   ? Kidney stones   ? Mood disorder ([place])   ? Neck pain   ? C4-C5  ? ? ?Past Medical History, Surgical history, Social history, and Family history were reviewed and updated as appropriate.  ? ?Please see review of systems for further details on the patient's review from today.  ? ?Objective:  ? ?Physical Exam:  ?There were no vitals taken for this visit. ? ?Physical Exam ?Constitutional:   ?   General: She is not in acute distress. ?Musculoskeletal:     ?   General: No deformity.  ?Neurological:   ?   Mental Status: She is alert and oriented to person, place, and time.  ?   Coordination: Coordination normal.  ?Psychiatric:     ?   Attention and Perception: Attention and perception normal. She does not perceive auditory or visual hallucinations.     ?   Mood and Affect: Mood is anxious and depressed. Affect is not labile, blunt, angry or inappropriate.     ?   Speech: Speech normal.     ?   Behavior: Behavior normal.     ?   Thought Content: Thought content normal. Thought content is not paranoid or delusional. Thought content does not include homicidal or suicidal ideation. Thought content does not include homicidal or suicidal plan.     ?   Cognition and Memory: Cognition and memory normal.     ?   Judgment: Judgment normal.  ?   Comments: Insight intact  ? ? ?Lab Review:  ?   ?Component Value Date/Time  ? NA 137 06/29/2017 1628  ? K 3.9 06/29/2017 1628  ? CL 105 06/29/2017 1628  ? CO2 24 06/29/2017 1628  ? GLUCOSE 91 06/29/2017 1628  ? BUN 14 06/29/2017 1628  ? CREATININE 0.84 06/29/2017 1628  ? CALCIUM 9.1 06/29/2017 1628  ? PROT 7.5 06/29/2017 1628  ? ALBUMIN 4.1 06/29/2017 1628  ? AST 17 06/29/2017 1628  ? ALT 15 06/29/2017 1628  ? ALKPHOS 59 06/29/2017 1628  ? BILITOT 0.6 06/29/2017 1628  ? GFRNONAA >60 06/29/2017 1628  ? GFRAA >60 06/29/2017 1628  ? ? ?   ?Component Value Date/Time  ? WBC 10.5 06/29/2017 1628  ? RBC 4.63 06/29/2017 1628  ? HGB 14.4 06/29/2017 1628  ? HCT 41.1 06/29/2017 1628  ? PLT 285 06/29/2017 1628  ? MCV 88.8 06/29/2017 1628  ? MCH 31.1 06/29/2017 1628  ? MCHC 35.0 06/29/2017 1628  ? RDW 13.1 06/29/2017 1628  ? LYMPHSABS 1.8 06/29/2017 1628  ? MONOABS 0.6 06/29/2017 1628  ? EOSABS 0.3 06/29/2017 1628  ? BASOSABS 0.0 06/29/2017 1628  ? ? ?No results found for: POCLITH, LITHIUM  ? ?No results found for: PHENYTOIN, PHENOBARB, VALPROATE, CBMZ  ? ?.res ?Assessment: Plan:   ?Pt seen for 30 minutes and time spent discussing FMLA intermittent leave and completing forms. Time also spent  reviewing recent stressors and the impact stressors have had on her mood and anxiety. She reports that she would like to continue current medications without changes at this time since she feels medications have been helpful and that current symptoms are mostly situational.  ?Will continue Prozac 30 mg daily for mood and anxiety.  ?Continue Wellbutrin XL 150 mg po qd for depression.  ?Continue Trazodone 100 mg 1/2-1 tab po QHS prn insomnia.  ?Continue Klonopin 0.5 mg po BID prn anxiety.  ?Pt to follow-up  in 6 months or sooner if clinically indicated.  ?Patient advised to contact office with any questions, adverse effects, or acute worsening in signs and symptoms. ? ? ?Maliea was seen today for follow-up. ? ?Diagnoses and all orders for this visit: ? ?Generalized anxiety disorder ?-     clonazePAM (KLONOPIN) 0.5 MG tablet; Take 1 tablet (0.5 mg total) by mouth 2 (two) times daily as needed. ?-     FLUoxetine (PROZAC) 10 MG capsule; TAKE 1 CAPSULE DAILY, TAKE WITH 20 MG TO EQUAL 30 MG DAILY ?-     FLUoxetine (PROZAC) 20 MG capsule; TAKE 1 CAPSULE WITH 10 MG TO EQUAL 30 MG DAILY ? ?Major depressive disorder, recurrent episode, moderate (HCC) ?-     buPROPion (WELLBUTRIN XL) 150 MG 24 hr tablet; Take 1 tablet (150 mg total) by mouth daily. ?-     FLUoxetine (PROZAC) 10 MG capsule; TAKE 1 CAPSULE DAILY, TAKE WITH 20 MG TO EQUAL 30 MG DAILY ?-     FLUoxetine (PROZAC) 20 MG capsule; TAKE 1 CAPSULE WITH 10 MG TO EQUAL 30 MG DAILY ?-     traZODone (DESYREL) 100 MG tablet; TAKE ONE-HALF (1/2) TO ONE TABLET AT BEDTIME FOR INSOMNIA ? ?  ? ?Please see After Visit Summary for patient specific instructions. ? ?Future Appointments  ?Date Time Provider [place]  ?05/11/2021  9:00 AM Thayer Headings, PMHNP CP-CP None  ? ? ?No orders of the defined types were placed in this encounter. ? ? ?------------------------------- ?

## 2021-04-11 NOTE — Telephone Encounter (Signed)
Pt brought forms on 04/04 at her office visit to be completed.  ?

## 2021-04-19 ENCOUNTER — Telehealth: Payer: Self-pay | Admitting: Psychiatry

## 2021-04-19 NOTE — Telephone Encounter (Signed)
Aryani called this morning at 9:20am because she had received a message from [place] that the [facility] paperwork was not complete.  She they needed questions 7&8 updated to reflect beginning dates to reflect from 02/24/2021.  And in #7 the duration:__hours ro __days per appointment needs to be completed.  All changes shold be initaled, dated and the form resigned with new date.  Please call [facility] if you have any questions.  Refax after updates have been done.  Form has been given back to [facility] for review. ?

## 2021-04-19 NOTE — Telephone Encounter (Signed)
Rtc to pt and updated #7 with dates effective 02/24/2021-02/23/2022, and 1 day per appointment. ?#8 with dates as listed above.  ? ?Initialed and signed by Dr. Clovis Pu and refaxed to [facility].  ?

## 2021-05-11 ENCOUNTER — Ambulatory Visit: Payer: BC Managed Care – PPO | Admitting: Psychiatry

## 2021-05-15 ENCOUNTER — Other Ambulatory Visit: Payer: Self-pay | Admitting: Psychiatry

## 2021-05-15 DIAGNOSIS — F331 Major depressive disorder, recurrent, moderate: Secondary | ICD-10-CM

## 2021-06-13 ENCOUNTER — Telehealth: Payer: Self-pay | Admitting: Psychiatry

## 2021-06-13 NOTE — Telephone Encounter (Signed)
Received Employee Job Accomodation Form. Placed in Traci's box 6/7

## 2021-06-18 DIAGNOSIS — Z0289 Encounter for other administrative examinations: Secondary | ICD-10-CM

## 2021-06-26 NOTE — Telephone Encounter (Signed)
This has been completed and emailed per request. They did confirm receiving it on 06/25/2021

## 2021-07-20 ENCOUNTER — Other Ambulatory Visit: Payer: Self-pay

## 2021-07-20 ENCOUNTER — Telehealth: Payer: Self-pay | Admitting: Psychiatry

## 2021-07-20 DIAGNOSIS — F331 Major depressive disorder, recurrent, moderate: Secondary | ICD-10-CM

## 2021-07-20 MED ORDER — TRAZODONE HCL 100 MG PO TABS: ORAL_TABLET | ORAL | 0 refills | Status: DC

## 2021-07-20 NOTE — Telephone Encounter (Signed)
Patient called in for refill on Trazodone '100mg'$ . She is completely out. Ph: [facility], [place]

## 2021-07-20 NOTE — Telephone Encounter (Signed)
RX SENT

## 2021-08-01 ENCOUNTER — Other Ambulatory Visit: Payer: Self-pay | Admitting: Psychiatry

## 2021-08-01 DIAGNOSIS — F331 Major depressive disorder, recurrent, moderate: Secondary | ICD-10-CM

## 2021-08-16 ENCOUNTER — Other Ambulatory Visit: Payer: Self-pay | Admitting: Psychiatry

## 2021-08-16 DIAGNOSIS — F411 Generalized anxiety disorder: Secondary | ICD-10-CM

## 2021-08-17 ENCOUNTER — Other Ambulatory Visit: Payer: Self-pay

## 2021-08-17 NOTE — Telephone Encounter (Signed)
Filled 6/14 f/u due in September

## 2021-08-17 NOTE — Telephone Encounter (Signed)
Pt called requesting Clonazepam RF. F/u due sept

## 2021-09-09 ENCOUNTER — Other Ambulatory Visit: Payer: Self-pay | Admitting: Psychiatry

## 2021-09-09 DIAGNOSIS — F411 Generalized anxiety disorder: Secondary | ICD-10-CM

## 2021-09-11 NOTE — Telephone Encounter (Signed)
Last filled 8/11, due 9/8

## 2021-09-13 NOTE — Telephone Encounter (Signed)
09/03/2021 09/03/2021 1  Hydrocodone-Homatropine Soln 120.00 6 [place] [id number] Nor (2879) 0/0 20.00 MME Comm Ins [place] 08/17/2021 08/17/2021 1  Clonazepam 0.5 Mg Tablet 60.00 30 Je Car

## 2021-09-18 ENCOUNTER — Other Ambulatory Visit: Payer: Self-pay

## 2021-09-18 ENCOUNTER — Telehealth: Payer: Self-pay | Admitting: Psychiatry

## 2021-09-18 DIAGNOSIS — F411 Generalized anxiety disorder: Secondary | ICD-10-CM

## 2021-09-18 MED ORDER — CLONAZEPAM 0.5 MG PO TABS: 0.5000 mg | ORAL_TABLET | Freq: Two times a day (BID) | ORAL | 0 refills | Status: DC | PRN

## 2021-09-18 NOTE — Telephone Encounter (Signed)
Pended.

## 2021-09-18 NOTE — Telephone Encounter (Signed)
Pt called requesting RF for Clonazepam [facility] [place]. Pt moved to [facility]. Pt requesting to delete all other pharmacies in chart. Also add [place] RF Fluoxetine & Bupropion [facility]. APT 9/25

## 2021-10-01 ENCOUNTER — Ambulatory Visit: Payer: BC Managed Care – PPO | Admitting: Psychiatry

## 2021-10-10 ENCOUNTER — Other Ambulatory Visit: Payer: Self-pay | Admitting: Psychiatry

## 2021-10-10 DIAGNOSIS — F331 Major depressive disorder, recurrent, moderate: Secondary | ICD-10-CM

## 2021-10-15 ENCOUNTER — Encounter: Payer: Self-pay | Admitting: Psychiatry

## 2021-10-15 ENCOUNTER — Telehealth: Payer: Self-pay | Admitting: Psychiatry

## 2021-10-15 ENCOUNTER — Ambulatory Visit (INDEPENDENT_AMBULATORY_CARE_PROVIDER_SITE_OTHER): Payer: BC Managed Care – PPO | Admitting: Psychiatry

## 2021-10-15 VITALS — Wt 229.0 lb

## 2021-10-15 DIAGNOSIS — G4700 Insomnia, unspecified: Secondary | ICD-10-CM

## 2021-10-15 DIAGNOSIS — F330 Major depressive disorder, recurrent, mild: Secondary | ICD-10-CM

## 2021-10-15 DIAGNOSIS — F411 Generalized anxiety disorder: Secondary | ICD-10-CM | POA: Diagnosis not present

## 2021-10-15 MED ORDER — CLONAZEPAM 0.5 MG PO TABS: 0.5000 mg | ORAL_TABLET | Freq: Two times a day (BID) | ORAL | 3 refills | Status: DC | PRN

## 2021-10-15 MED ORDER — BUPROPION HCL ER (XL) 150 MG PO TB24: 150.0000 mg | ORAL_TABLET | Freq: Every day | ORAL | 1 refills | Status: DC

## 2021-10-15 MED ORDER — FLUOXETINE HCL 20 MG PO CAPS: ORAL_CAPSULE | ORAL | 1 refills | Status: DC

## 2021-10-15 MED ORDER — FLUOXETINE HCL 10 MG PO CAPS: ORAL_CAPSULE | ORAL | 1 refills | Status: DC

## 2021-10-15 MED ORDER — TRAZODONE HCL 100 MG PO TABS: ORAL_TABLET | ORAL | 1 refills | Status: DC

## 2021-10-15 NOTE — Telephone Encounter (Signed)
Received FMLA forms by email from Pt for [employer]. Sent to nurse.

## 2021-10-15 NOTE — Progress Notes (Signed)
Carmen Robinson [id number] [birth year] 51 y.o.  Virtual Visit via Telephone Note  I connected with pt on 10/15/21 at 10:30 AM EDT by telephone and verified that I am speaking with the correct person using two identifiers.   I discussed the limitations, risks, security and privacy concerns of performing an evaluation and management service by telephone and the availability of in person appointments. I also discussed with the patient that there may be a patient responsible charge related to this service. The patient expressed understanding and agreed to proceed.   I discussed the assessment and treatment plan with the patient. The patient was provided an opportunity to ask questions and all were answered. The patient agreed with the plan and demonstrated an understanding of the instructions.   The patient was advised to call back or seek an in-person evaluation if the symptoms worsen or if the condition fails to improve as anticipated.  I provided 23 minutes of non-face-to-face time during this encounter.  The patient was located at home.  The provider was located at [place].   Thayer Headings, PMHNP   Subjective:   Patient ID:  Carmen Robinson is a 51 y.o. (DOB 16-Dec-1970) female.  Chief Complaint:  Chief Complaint  Patient presents with   Follow-up    Anxiety and Depression    HPI KEAIRRA BARDON presents for follow-up of anxiety and depression. She reports that her anxiety is "fine, as long as I am on my medications." She reports that her divorce has been finalized after 5 years and selling house with higher mortgage has helped relieve her stress. She reports that she has not had any recent panic attacks. She reports that she has episodic anxiety. She reports anxiety at work.  She reports that her depression has been less overall. She reports occasional irritability. She reports improved energy with doing water aerobics/[place]. Motivation has improved.  Concentration has been adequate. Appetite has been good. Denies SI.  She sold her house and moved into her new home in [place].   She reports that FMLA paperwork needs to be renewed. She reports that she misses a day a week on occasion. She reports that her work is stressful.   She reports taking Klonopin prn periodically.   Past Psychiatric Medication Trials: Klonopin Prozac- Jittery at 40 mg po qd. Some benefit at 30 mg qd.  Effexor XR- Side effects on doses above 75 mg qd Zoloft- "Numb" Wellbutrin- Felt jittery and had possible increased irritability with XL 150 mg qd.  Lamictal-cognitive side effects Adderall Trazodone- Effective Hydroxyzine- prescribed for poison ivy [place]  Review of Systems:  Review of Systems  HENT:  Positive for congestion, sinus pressure and sinus pain.        Reports that she has had repeated sinus infections  Gastrointestinal: Negative.   Musculoskeletal:  Negative for gait problem.  Neurological:  Negative for tremors.  Psychiatric/Behavioral:         Please refer to HPI    Medications: I have reviewed the patient's current medications.  Current Outpatient Medications  Medication Sig Dispense Refill   buPROPion (WELLBUTRIN XL) 150 MG 24 hr tablet Take 1 tablet (150 mg total) by mouth daily. 90 tablet 1   [START ON 10/16/2021] clonazePAM (KLONOPIN) 0.5 MG tablet Take 1 tablet (0.5 mg total) by mouth 2 (two) times daily as needed. 60 tablet 3   FLUoxetine (PROZAC) 10 MG capsule TAKE 1 CAPSULE DAILY, TAKE WITH 20 MG TO EQUAL 30 MG DAILY  90 capsule 1   FLUoxetine (PROZAC) 20 MG capsule TAKE 1 CAPSULE WITH 10 MG TO EQUAL 30 MG DAILY 90 capsule 1   traZODone (DESYREL) 100 MG tablet TAKE ONE-HALF (1/2) TO ONE TABLET AT BEDTIME FOR INSOMNIA 90 tablet 1   No current facility-administered medications for this visit.    Medication Side Effects: None  Allergies:  Allergies  Allergen Reactions   Iodinated Contrast Media Swelling   Silver Rash    Penicillins    Shellfish Allergy    Prednisone Anxiety    Past Medical History:  Diagnosis Date   Anxiety    Hyperlipidemia    Kidney stones    Mood disorder (HCC)    Neck pain    C4-C5    Family History  Problem Relation Age of Onset   Hyperlipidemia Mother    Fibromyalgia Mother    Irritable bowel syndrome Mother    Arthritis Mother    Anxiety disorder Mother    Diabetes Father    Asthma Son    Heart disease Maternal Aunt    Aneurysm Maternal Aunt    AAA (abdominal aortic aneurysm) Maternal Uncle    Stroke Maternal Grandfather    Heart disease Paternal Grandfather    Bipolar disorder Brother    Diabetes Brother    ADD / ADHD Son     Social History   Socioeconomic History   Marital status: Married    Spouse name: Not on file   Number of children: 2   Years of education: Not on file   Highest education level: Not on file  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   Works [employer].   Wears her seatbelt.   Smoke detector in the home.    Social Determinants of Health   Financial Resource Strain: Not on file  Food Insecurity: Not on file  Transportation Needs: Not on file  Physical Activity: Not on file  Stress: Not on file  Social Connections: Not on file  Intimate Partner Violence: Not on file    Past Medical History, Surgical history, Social history, and Family history were reviewed and updated as appropriate.   Please see review of systems for further details on the patient's review from today.   Objective:   Physical Exam:  Wt 229 lb (103.9  kg)   BMI 40.57 kg/m   Physical Exam Neurological:     Mental Status: She is alert and oriented to person, place, and time.     Cranial Nerves: No dysarthria.  Psychiatric:        Attention and Perception: Attention and perception normal.        Mood and Affect: Mood normal.        Speech: Speech normal.        Behavior: Behavior is cooperative.        Thought Content: Thought content normal. Thought content is not paranoid or delusional. Thought content does not include homicidal or suicidal ideation. Thought content does  not include homicidal or suicidal plan.        Cognition and Memory: Cognition and memory normal.        Judgment: Judgment normal.     Comments: Insight intact     Lab Review:     Component Value Date/Time   NA 137 06/29/2017 1628   K 3.9 06/29/2017 1628   CL 105 06/29/2017 1628   CO2 24 06/29/2017 1628   GLUCOSE 91 06/29/2017 1628   BUN 14 06/29/2017 1628   CREATININE 0.84 06/29/2017 1628   CALCIUM 9.1 06/29/2017 1628   PROT 7.5 06/29/2017 1628   ALBUMIN 4.1 06/29/2017 1628   AST 17 06/29/2017 1628   ALT 15 06/29/2017 1628   ALKPHOS 59 06/29/2017 1628   BILITOT 0.6 06/29/2017 1628   GFRNONAA >60 06/29/2017 1628   GFRAA >60 06/29/2017 1628       Component Value Date/Time   WBC 10.5 06/29/2017 1628   RBC 4.63 06/29/2017 1628   HGB 14.4 06/29/2017 1628   HCT 41.1 06/29/2017 1628   PLT 285 06/29/2017 1628   MCV 88.8 06/29/2017 1628   MCH 31.1 06/29/2017 1628   MCHC 35.0 06/29/2017 1628   RDW 13.1 06/29/2017 1628   LYMPHSABS 1.8 06/29/2017 1628   MONOABS 0.6 06/29/2017 1628   EOSABS 0.3 06/29/2017 1628   BASOSABS 0.0 06/29/2017 1628    No results found for: "POCLITH", "LITHIUM"   No results found for: "PHENYTOIN", "PHENOBARB", "VALPROATE", "CBMZ"   .res Assessment: Plan:   Pt seen for 23 minutes and time spent reviewing recent changes to social history and the effect this has had on her mood and anxiety. She reports improved mood and  less anxiety after moving, having lower mortgage payment, and divorce being finalized. She reports that she continues to have some mild depression and occasional anxiety.  Will renew intermittent FMLA with same recommendations.  Continue Prozac 30 mg daily for anxiety and depression.  Continue Wellbutrin XL 150 mg po qd for depression.  Continue Klonopin 0.5 mg po BID prn anxiety.  Continue Trazodone 50-100 mg po QHS for insomnia.  Pt to follow-up in 6 months or sooner if clinically indicated.  Patient advised to contact office with any questions, adverse effects, or acute worsening in signs and symptoms.   Cianna was seen today for follow-up.  Diagnoses and all orders for this visit:  Mild episode of recurrent major depressive disorder (HCC) -     buPROPion (WELLBUTRIN XL) 150 MG 24 hr tablet; Take 1 tablet (150 mg total) by mouth daily. -     FLUoxetine (PROZAC) 10 MG capsule; TAKE 1 CAPSULE DAILY, TAKE WITH 20 MG TO EQUAL 30 MG DAILY -     FLUoxetine (PROZAC) 20 MG capsule; TAKE 1 CAPSULE WITH 10 MG TO EQUAL 30 MG DAILY -     traZODone (DESYREL) 100 MG tablet; TAKE ONE-HALF (1/2) TO ONE TABLET AT BEDTIME FOR INSOMNIA  Generalized anxiety disorder -     FLUoxetine (PROZAC) 10 MG capsule; TAKE 1 CAPSULE DAILY, TAKE WITH 20 MG TO EQUAL 30 MG DAILY -     FLUoxetine (PROZAC) 20 MG capsule; TAKE 1 CAPSULE WITH 10 MG TO EQUAL 30 MG DAILY -     clonazePAM (KLONOPIN) 0.5 MG tablet; Take 1 tablet (0.5 mg total) by mouth 2 (two) times daily as needed.    Please see After Visit Summary for patient specific instructions.  No future appointments.  No orders of the defined types were placed in  this encounter.     -------------------------------

## 2021-10-17 NOTE — Telephone Encounter (Signed)
Working on forms, will complete and get Janett Billow to review and sign

## 2021-10-19 ENCOUNTER — Telehealth: Payer: Self-pay | Admitting: Psychiatry

## 2021-10-19 DIAGNOSIS — Z0289 Encounter for other administrative examinations: Secondary | ICD-10-CM

## 2021-10-19 NOTE — Telephone Encounter (Signed)
Notified Pt FMLA  [place] completed and faxed.

## 2021-10-22 NOTE — Telephone Encounter (Signed)
Forms completed on 10/19/2021

## 2021-10-26 ENCOUNTER — Other Ambulatory Visit: Payer: Self-pay | Admitting: Psychiatry

## 2021-10-26 DIAGNOSIS — F411 Generalized anxiety disorder: Secondary | ICD-10-CM

## 2021-10-26 DIAGNOSIS — F330 Major depressive disorder, recurrent, mild: Secondary | ICD-10-CM

## 2021-10-26 NOTE — Telephone Encounter (Signed)
Refills had been sent to [facility]. Where should they be sent? LVM to [facility].

## 2021-10-29 NOTE — Telephone Encounter (Signed)
Patient said they should go to [place].

## 2021-11-09 ENCOUNTER — Telehealth: Payer: Self-pay | Admitting: Psychiatry

## 2021-11-09 ENCOUNTER — Other Ambulatory Visit: Payer: Self-pay

## 2021-11-09 DIAGNOSIS — G4700 Insomnia, unspecified: Secondary | ICD-10-CM

## 2021-11-09 MED ORDER — TRAZODONE HCL 100 MG PO TABS: ORAL_TABLET | ORAL | 0 refills | Status: DC

## 2021-11-09 MED ORDER — TRAZODONE HCL 100 MG PO TABS: ORAL_TABLET | ORAL | 1 refills | Status: DC

## 2021-11-09 NOTE — Telephone Encounter (Signed)
Sent!

## 2021-11-09 NOTE — Addendum Note (Signed)
Addended by: Bonney Leitz T on: 11/09/2021 05:16 PM   Modules accepted: Orders

## 2021-11-09 NOTE — Telephone Encounter (Signed)
Pt requesting RF for Trazodone to [facility] for 90 days also, [facility] mo [facility] [address]  [place] [place]  She is out.

## 2021-11-12 ENCOUNTER — Other Ambulatory Visit: Payer: Self-pay | Admitting: Psychiatry

## 2021-11-12 DIAGNOSIS — F330 Major depressive disorder, recurrent, mild: Secondary | ICD-10-CM

## 2021-12-03 ENCOUNTER — Other Ambulatory Visit: Payer: Self-pay | Admitting: Psychiatry

## 2021-12-03 DIAGNOSIS — G4700 Insomnia, unspecified: Secondary | ICD-10-CM

## 2021-12-06 ENCOUNTER — Telehealth: Payer: Self-pay | Admitting: Psychiatry

## 2021-12-06 DIAGNOSIS — F330 Major depressive disorder, recurrent, mild: Secondary | ICD-10-CM

## 2021-12-06 DIAGNOSIS — F411 Generalized anxiety disorder: Secondary | ICD-10-CM

## 2021-12-06 MED ORDER — FLUOXETINE HCL 10 MG PO CAPS: ORAL_CAPSULE | ORAL | 0 refills | Status: DC

## 2021-12-06 MED ORDER — FLUOXETINE HCL 20 MG PO CAPS: ORAL_CAPSULE | ORAL | 0 refills | Status: DC

## 2021-12-06 NOTE — Telephone Encounter (Signed)
Called [facility] to see why patient had not received Rx. The patient has moved and did not notify [facility] and did not respond to communication from [place] regarding the correct address, so it was not sent. Contacted patient and provided her this information. She will call to provide needed information and a 30-day RF will be sent to the requested pharmacy.

## 2021-12-06 NOTE — Telephone Encounter (Signed)
Carmen Robinson called today at 10:40 to request refill of both her 10 and '20mg'$  fluoxetine.  She said she never got any shipment from [facility]. So now she is out.  [facility] [address] [facility] [place].  Appt 04/22/22

## 2021-12-06 NOTE — Telephone Encounter (Signed)
Sent!

## 2022-01-01 ENCOUNTER — Other Ambulatory Visit: Payer: Self-pay | Admitting: Psychiatry

## 2022-01-01 DIAGNOSIS — F330 Major depressive disorder, recurrent, mild: Secondary | ICD-10-CM

## 2022-01-01 DIAGNOSIS — F411 Generalized anxiety disorder: Secondary | ICD-10-CM

## 2022-04-08 ENCOUNTER — Other Ambulatory Visit: Payer: Self-pay | Admitting: Psychiatry

## 2022-04-08 DIAGNOSIS — F411 Generalized anxiety disorder: Secondary | ICD-10-CM

## 2022-04-20 ENCOUNTER — Other Ambulatory Visit: Payer: Self-pay | Admitting: Psychiatry

## 2022-04-20 DIAGNOSIS — F330 Major depressive disorder, recurrent, mild: Secondary | ICD-10-CM

## 2022-04-20 DIAGNOSIS — F411 Generalized anxiety disorder: Secondary | ICD-10-CM

## 2022-04-21 NOTE — Telephone Encounter (Signed)
Has appt. tomorrow

## 2022-04-22 ENCOUNTER — Ambulatory Visit: Payer: BC Managed Care – PPO | Admitting: Psychiatry

## 2022-04-24 NOTE — Telephone Encounter (Signed)
Please call to schedule appt, was a no show/cancel this week.

## 2022-04-25 NOTE — Telephone Encounter (Signed)
LVM to call office for follow up apt 

## 2022-04-29 ENCOUNTER — Other Ambulatory Visit: Payer: Self-pay | Admitting: Psychiatry

## 2022-04-29 DIAGNOSIS — F330 Major depressive disorder, recurrent, mild: Secondary | ICD-10-CM

## 2022-04-29 DIAGNOSIS — G4700 Insomnia, unspecified: Secondary | ICD-10-CM

## 2022-05-03 ENCOUNTER — Telehealth: Payer: Self-pay | Admitting: Psychiatry

## 2022-05-03 NOTE — Telephone Encounter (Signed)
Pt faxed over disability forms for [employer]. Given to nurse to complete.

## 2022-05-06 NOTE — Telephone Encounter (Signed)
Received FMLA on Friday, April 26th. Pt's next apt is 05/06, her last apt 10/2021.  Pt sent note asking if paper work could be completed and faxed prior to apt. Although on paper work date due is 05/09, this is a 6 month renewal.

## 2022-05-12 NOTE — Telephone Encounter (Signed)
Paper work completed and given to [name] to review with pt at visit on 05/13/22

## 2022-05-13 ENCOUNTER — Encounter: Payer: Self-pay | Admitting: Psychiatry

## 2022-05-13 ENCOUNTER — Telehealth: Payer: Self-pay | Admitting: Psychiatry

## 2022-05-13 ENCOUNTER — Ambulatory Visit (INDEPENDENT_AMBULATORY_CARE_PROVIDER_SITE_OTHER): Payer: BC Managed Care – PPO | Admitting: Psychiatry

## 2022-05-13 VITALS — Wt 222.0 lb

## 2022-05-13 DIAGNOSIS — Z0289 Encounter for other administrative examinations: Secondary | ICD-10-CM

## 2022-05-13 DIAGNOSIS — G4700 Insomnia, unspecified: Secondary | ICD-10-CM | POA: Diagnosis not present

## 2022-05-13 DIAGNOSIS — F411 Generalized anxiety disorder: Secondary | ICD-10-CM

## 2022-05-13 DIAGNOSIS — F331 Major depressive disorder, recurrent, moderate: Secondary | ICD-10-CM

## 2022-05-13 MED ORDER — CLONAZEPAM 0.5 MG PO TABS
ORAL_TABLET | ORAL | 5 refills | Status: DC
Start: 2022-05-13 — End: 2022-11-18

## 2022-05-13 MED ORDER — AUVELITY 45-105 MG PO TBCR
1.0000 | EXTENDED_RELEASE_TABLET | Freq: Two times a day (BID) | ORAL | 2 refills | Status: DC
Start: 2022-05-13 — End: 2022-07-09

## 2022-05-13 MED ORDER — AUVELITY 45-105 MG PO TBCR
EXTENDED_RELEASE_TABLET | ORAL | 0 refills | Status: DC
Start: 2022-05-13 — End: 2022-07-09

## 2022-05-13 MED ORDER — HYDROXYZINE PAMOATE 25 MG PO CAPS: ORAL_CAPSULE | ORAL | 2 refills | Status: DC

## 2022-05-13 MED ORDER — FLUOXETINE HCL 10 MG PO CAPS
ORAL_CAPSULE | ORAL | 1 refills | Status: AC
Start: 2022-05-13 — End: ?

## 2022-05-13 MED ORDER — FLUOXETINE HCL 20 MG PO CAPS
ORAL_CAPSULE | ORAL | 1 refills | Status: AC
Start: 2022-05-13 — End: ?

## 2022-05-13 NOTE — Telephone Encounter (Signed)
[facility] [place] 45-105mg  , see CMM

## 2022-05-13 NOTE — Progress Notes (Signed)
Carmen Robinson [id number] October 01, 1970 53 y.o.  Subjective:   Patient ID:  Carmen Robinson is a 52 y.o. (DOB Feb 03, 1970) female.  Chief Complaint:  Chief Complaint  Patient presents with   Depression    Depression        Associated symptoms include no headaches.  Carmen Robinson presents to the office today for follow-up of anxiety and depression. She reports that her boyfriend has been unemployed and this is causing them some financial stress. "I get depressed. I just want to lay in the bed. I don't think I need medication. I have not been wanting to go to work. I have been using every bit of those FMLA days." She reports persistent depressed mood. She reports that she has had some irritability. She reports some excessive worry and rumination. Denies panic attacks. She reports very low energy and motivation. She reports difficulty getting out of bed and going to work- "I have to make myself get up." She reports motivation is also low for completing chores and other activities outside of work. She reports 7 lbs intentional weight loss since starting [employer]. Sleeping well. Sleeping excessively at times. Reports, "yesterday I slept all day" and stayed in bed on her day off until 6 pm. She reports that her concentration is ok- "I need to go to work, because I do better when I am there." Denies SI.   She reports that she has been diagnosed with migraines and started on Propranolol ER and this has helped with headaches.  She reports that she takes Klonopin prn only occasionally.   Past Psychiatric Medication Trials: Klonopin Prozac- Jittery at 40 mg po qd. Some benefit at 30 mg qd.  Effexor XR- Side effects on doses above 75 mg qd Zoloft- "Numb" Wellbutrin- Felt jittery and had possible increased irritability with XL 150 mg qd.  Lamictal-cognitive side effects Adderall Trazodone- Effective. Some grogginess if she takes it later.  Hydroxyzine- prescribed for poison ivy Deplin  GAD-7     Flowsheet Row Office Visit from 12/25/2015 in [facility]  Total GAD-7 Score 13      PHQ2-9    Flowsheet Row Office Visit from 11/11/2016 in [facility] Visit from 12/25/2015 in [facility] [place] Office Visit from 09/05/2015 in [facility] Total Score 0 4 0  PHQ-9 Total Score 0 13 --        Review of Systems:  Review of Systems  Musculoskeletal:  Negative for gait problem.  Neurological:  Negative for tremors and headaches.  Psychiatric/Behavioral:  Positive for depression.        Please refer to HPI    Medications: I have reviewed the patient's current medications.  Current Outpatient Medications  Medication Sig Dispense Refill   Dextromethorphan-buPROPion ER (AUVELITY) 45-105 MG TBCR Take 1 tablet daily for 3 days, then increase to 1 tablet twice daily 90 tablet 0   Dextromethorphan-buPROPion ER (AUVELITY) 45-105 MG TBCR Take 1 tablet by mouth 2 (two) times daily. 60 tablet 2   propranolol ER (INDERAL LA) 80 MG 24 hr capsule Take 80 mg by mouth daily.     simvastatin (ZOCOR) 10 MG tablet TAKE ONE TABLET M/W/F AT BEDTIME     traZODone (DESYREL) 100 MG tablet TAKE 1/2 TO 1 TABLET AT    BEDTIME FOR INSOMNIA 90 tablet 1   WEGOVY 0.5 MG/0.5ML SOAJ Inject 0.5 mg  into the skin once a week.     clonazePAM (KLONOPIN) 0.5 MG tablet TAKE 1 TABLET BY MOUTH 2 TIMES DAILY AS NEEDED. 60 tablet 5   FLUoxetine (PROZAC) 10 MG capsule TAKE 1 CAPSULE DAILY WITH 20MG  TO EQUAL 30MG  DAILY 90 capsule 1   FLUoxetine (PROZAC) 20 MG capsule TAKE 1 CAPSULE WITH 10MG  TO EQUAL 30MG  DAILY 90 capsule 1   hydrOXYzine (VISTARIL) 25 MG capsule Take 1-2 capsules po QHS prn insomnia 60 capsule 2   SUMAtriptan (IMITREX) 100 MG tablet Take 100 mg by mouth. (Patient not taking: Reported on 05/13/2022)     No current facility-administered medications for this visit.    Medication  Side Effects: None  Allergies:  Allergies  Allergen Reactions   Iodinated Contrast Media Swelling   Silver Rash   Penicillins    Shellfish Allergy    Prednisone Anxiety    Past Medical History:  Diagnosis Date   Anxiety    Hyperlipidemia    Kidney stones    Mood disorder (HCC)    Neck pain    C4-C5    Past Medical History, Surgical history, Social history, and Family history were reviewed and updated as appropriate.   Please see review of systems for further details on the patient's review from today.   Objective:   Physical Exam:  Wt 222 lb (100.7 kg)   BMI 39.33 kg/m   Physical Exam Constitutional:      General: She is not in acute distress. Musculoskeletal:        General: No deformity.  Neurological:     Mental Status: She is alert and oriented to person, place, and time.     Coordination: Coordination normal.  Psychiatric:        Attention and Perception: Attention and perception normal. She does not perceive auditory or visual hallucinations.        Mood and Affect: Mood is depressed. Affect is not labile, blunt, angry or inappropriate.        Speech: Speech normal.        Behavior: Behavior normal.        Thought Content: Thought content normal. Thought content is not paranoid or delusional. Thought content does not include homicidal or suicidal ideation. Thought content does not include homicidal or suicidal plan.        Cognition and Memory: Cognition and memory normal.        Judgment: Judgment normal.     Comments: Insight intact Mood is mildly anxious     Lab Review:     Component Value Date/Time   NA 137 06/29/2017 1628   K 3.9 06/29/2017 1628   CL 105 06/29/2017 1628   CO2 24 06/29/2017 1628   GLUCOSE 91 06/29/2017 1628   BUN 14 06/29/2017 1628   CREATININE 0.84 06/29/2017 1628   CALCIUM 9.1 06/29/2017 1628   PROT 7.5 06/29/2017 1628   ALBUMIN 4.1 06/29/2017 1628   AST 17 06/29/2017 1628   ALT 15 06/29/2017 1628   ALKPHOS 59  06/29/2017 1628   BILITOT 0.6 06/29/2017 1628   GFRNONAA >60 06/29/2017 1628   GFRAA >60 06/29/2017 1628       Component Value Date/Time   WBC 10.5 06/29/2017 1628   RBC 4.63 06/29/2017 1628   HGB 14.4 06/29/2017 1628   HCT 41.1 06/29/2017 1628   PLT 285 06/29/2017 1628   MCV 88.8 06/29/2017 1628   MCH 31.1 06/29/2017 1628   MCHC 35.0 06/29/2017 1628   RDW  13.1 06/29/2017 1628   LYMPHSABS 1.8 06/29/2017 1628   MONOABS 0.6 06/29/2017 1628   EOSABS 0.3 06/29/2017 1628   BASOSABS 0.0 06/29/2017 1628    No results found for: "POCLITH", "LITHIUM"   No results found for: "PHENYTOIN", "PHENOBARB", "VALPROATE", "CBMZ"   .res Assessment: Plan:    Discussed potential benefits, risks, and side effects of Auvelity. Pt agrees to trial of Auvelity. Will start Auvelity 45-105 mg one tablet daily for 3 days, then increase Auvelity to one tablet twice daily for depression. Pt advised to stop taking Wellbutrin since Auvelity contains Bupropion XL.  Will start trial of hydroxyzine as needed for insomnia since patient reports that trazodone causes excessive grogginess upon awakening if she does not take it early enough.  Discussed that she could resume trazodone if hydroxyzine is not effective for insomnia.  Discussed using a reminder alarm to take trazodone before 8 pm since she reports difficulty waking up the following morning if she takes trazodone after 8 PM. Will continue Prozac 30 mg daily for depression and anxiety. Continue Klonopin 0.5 mg twice daily as needed for anxiety. Recommend continuing current intermittent FMLA accommodations for her when patient experiences exacerbation of anxiety and depressive signs and symptoms that interfere with her ability to work. Patient to follow-up in 4 months or sooner if clinically indicated. Patient advised to contact office with any questions, adverse effects, or acute worsening in signs and symptoms.   Carmen Robinson was seen today for  depression.  Diagnoses and all orders for this visit:  Major depressive disorder, recurrent episode, moderate (HCC) -     Dextromethorphan-buPROPion ER (AUVELITY) 45-105 MG TBCR; Take 1 tablet daily for 3 days, then increase to 1 tablet twice daily -     Dextromethorphan-buPROPion ER (AUVELITY) 45-105 MG TBCR; Take 1 tablet by mouth 2 (two) times daily. -     FLUoxetine (PROZAC) 20 MG capsule; TAKE 1 CAPSULE WITH 10MG  TO EQUAL 30MG  DAILY -     FLUoxetine (PROZAC) 10 MG capsule; TAKE 1 CAPSULE DAILY WITH 20MG  TO EQUAL 30MG  DAILY  Generalized anxiety disorder -     FLUoxetine (PROZAC) 20 MG capsule; TAKE 1 CAPSULE WITH 10MG  TO EQUAL 30MG  DAILY -     FLUoxetine (PROZAC) 10 MG capsule; TAKE 1 CAPSULE DAILY WITH 20MG  TO EQUAL 30MG  DAILY -     clonazePAM (KLONOPIN) 0.5 MG tablet; TAKE 1 TABLET BY MOUTH 2 TIMES DAILY AS NEEDED.  Insomnia, unspecified type -     hydrOXYzine (VISTARIL) 25 MG capsule; Take 1-2 capsules po QHS prn insomnia     Please see After Visit Summary for patient specific instructions.  Future Appointments  Date Time Provider Department Center  09/16/2022  8:30 AM Corie Chiquito, PMHNP [facility] None    No orders of the defined types were placed in this encounter.   -------------------------------

## 2022-05-14 NOTE — Telephone Encounter (Signed)
Pending determination

## 2022-06-04 ENCOUNTER — Other Ambulatory Visit: Payer: Self-pay | Admitting: Psychiatry

## 2022-06-04 DIAGNOSIS — G4700 Insomnia, unspecified: Secondary | ICD-10-CM

## 2022-06-10 ENCOUNTER — Telehealth: Payer: Self-pay

## 2022-06-10 NOTE — Telephone Encounter (Signed)
Prior Authorization submitted and approved for AUVELITY 45-105 MG EFFECTIVE 05/15/2022-05/15/2023 with [facility]

## 2022-06-15 NOTE — Telephone Encounter (Signed)
Approved through 05/15/2023 for [employer]

## 2022-07-09 ENCOUNTER — Telehealth: Payer: Self-pay | Admitting: Psychiatry

## 2022-07-09 DIAGNOSIS — F330 Major depressive disorder, recurrent, mild: Secondary | ICD-10-CM

## 2022-07-09 MED ORDER — BUPROPION HCL ER (XL) 150 MG PO TB24
150.0000 mg | ORAL_TABLET | Freq: Every day | ORAL | 1 refills | Status: DC
Start: 2022-07-09 — End: 2022-11-18

## 2022-07-09 NOTE — Telephone Encounter (Signed)
Please let her know that script for Wellbutrin XL has been sent in.

## 2022-07-09 NOTE — Telephone Encounter (Signed)
Please see message from patient

## 2022-07-09 NOTE — Telephone Encounter (Signed)
Pt called reporting not much difference with new med Auvelity. Med also has $25 copay. . Pt requesting to start back Bupropion and stop Auvelity. B[facility]rop[facility] zero copay[facility] [address] [place] [address]

## 2022-07-09 NOTE — Telephone Encounter (Signed)
LVM with info per DPR.  

## 2022-07-30 ENCOUNTER — Other Ambulatory Visit: Payer: Self-pay | Admitting: Psychiatry

## 2022-07-30 DIAGNOSIS — G4700 Insomnia, unspecified: Secondary | ICD-10-CM

## 2022-09-10 ENCOUNTER — Telehealth: Payer: Self-pay | Admitting: Psychiatry

## 2022-09-10 NOTE — Telephone Encounter (Signed)
Carmen Robinson called today at 3:50pm. [place] and came back without it. [facility]  [id number] [place] [place] - [address]  [address], [place] [id number]

## 2022-09-10 NOTE — Telephone Encounter (Signed)
Patient has a RF available. Told her insurance wouldn't pay for it, but a 90-day supply is $27.19 using [facility].

## 2022-09-16 ENCOUNTER — Ambulatory Visit: Payer: BC Managed Care – PPO | Admitting: Psychiatry

## 2022-09-20 ENCOUNTER — Other Ambulatory Visit: Payer: Self-pay | Admitting: Psychiatry

## 2022-09-20 DIAGNOSIS — G4700 Insomnia, unspecified: Secondary | ICD-10-CM

## 2022-10-02 ENCOUNTER — Other Ambulatory Visit: Payer: Self-pay | Admitting: Psychiatry

## 2022-10-02 DIAGNOSIS — F411 Generalized anxiety disorder: Secondary | ICD-10-CM

## 2022-10-02 DIAGNOSIS — F331 Major depressive disorder, recurrent, moderate: Secondary | ICD-10-CM

## 2022-10-20 ENCOUNTER — Other Ambulatory Visit: Payer: Self-pay | Admitting: Psychiatry

## 2022-10-20 DIAGNOSIS — F331 Major depressive disorder, recurrent, moderate: Secondary | ICD-10-CM

## 2022-10-20 DIAGNOSIS — F411 Generalized anxiety disorder: Secondary | ICD-10-CM

## 2022-10-27 ENCOUNTER — Other Ambulatory Visit: Payer: Self-pay | Admitting: Psychiatry

## 2022-10-27 DIAGNOSIS — G4700 Insomnia, unspecified: Secondary | ICD-10-CM

## 2022-10-27 NOTE — Telephone Encounter (Signed)
Has appt. tomorrow

## 2022-10-28 ENCOUNTER — Ambulatory Visit: Payer: BC Managed Care – PPO | Admitting: Psychiatry

## 2022-11-04 ENCOUNTER — Ambulatory Visit: Payer: BC Managed Care – PPO | Admitting: Psychiatry

## 2022-11-15 ENCOUNTER — Telehealth: Payer: Self-pay | Admitting: Psychiatry

## 2022-11-15 NOTE — Telephone Encounter (Signed)
Received Job Accomodation Request Form. Given to [facility] 11/8

## 2022-11-18 ENCOUNTER — Encounter: Payer: Self-pay | Admitting: Psychiatry

## 2022-11-18 ENCOUNTER — Telehealth (INDEPENDENT_AMBULATORY_CARE_PROVIDER_SITE_OTHER): Payer: BC Managed Care – PPO | Admitting: Psychiatry

## 2022-11-18 DIAGNOSIS — F411 Generalized anxiety disorder: Secondary | ICD-10-CM | POA: Diagnosis not present

## 2022-11-18 DIAGNOSIS — G4700 Insomnia, unspecified: Secondary | ICD-10-CM | POA: Diagnosis not present

## 2022-11-18 DIAGNOSIS — F331 Major depressive disorder, recurrent, moderate: Secondary | ICD-10-CM | POA: Diagnosis not present

## 2022-11-18 MED ORDER — FLUOXETINE HCL 20 MG PO CAPS
ORAL_CAPSULE | ORAL | 1 refills | Status: AC
Start: 2022-11-18 — End: ?

## 2022-11-18 MED ORDER — CLONAZEPAM 0.5 MG PO TABS: ORAL_TABLET | ORAL | 5 refills | Status: AC

## 2022-11-18 MED ORDER — TRAZODONE HCL 100 MG PO TABS: ORAL_TABLET | ORAL | 1 refills | Status: DC

## 2022-11-18 MED ORDER — BUPROPION HCL ER (XL) 150 MG PO TB24: 150.0000 mg | ORAL_TABLET | Freq: Every day | ORAL | 1 refills | Status: DC

## 2022-11-18 MED ORDER — FLUOXETINE HCL 10 MG PO CAPS: ORAL_CAPSULE | ORAL | 1 refills | Status: AC
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
## 2022-11-20 ENCOUNTER — Encounter: Payer: Self-pay | Admitting: Psychiatry

## 2022-11-20 NOTE — Telephone Encounter (Signed)
Working on [employer] and will have [name] review and sign

## 2022-11-22 DIAGNOSIS — Z0289 Encounter for other administrative examinations: Secondary | ICD-10-CM

## 2022-11-22 NOTE — Telephone Encounter (Signed)
Paperwork faxed and emailed.

## 2022-11-27 ENCOUNTER — Ambulatory Visit: Payer: BC Managed Care – PPO | Admitting: Psychiatry

## 2022-12-11 ENCOUNTER — Other Ambulatory Visit: Payer: Self-pay | Admitting: Psychiatry

## 2022-12-11 DIAGNOSIS — F331 Major depressive disorder, recurrent, moderate: Secondary | ICD-10-CM

## 2023-01-16 ENCOUNTER — Other Ambulatory Visit: Payer: Self-pay | Admitting: Psychiatry

## 2023-01-16 DIAGNOSIS — F331 Major depressive disorder, recurrent, moderate: Secondary | ICD-10-CM

## 2023-04-02 ENCOUNTER — Telehealth: Payer: Self-pay | Admitting: Psychiatry

## 2023-04-02 DIAGNOSIS — G4700 Insomnia, unspecified: Secondary | ICD-10-CM

## 2023-04-02 MED ORDER — TRAZODONE HCL 100 MG PO TABS: ORAL_TABLET | ORAL | 0 refills | Status: AC

## 2023-04-02 NOTE — Telephone Encounter (Signed)
 [facility]

## 2023-04-02 NOTE — Telephone Encounter (Signed)
 Pt has an appt with [name] in mid April she needs a refill on her trazodone 100 mg. [facility] [address] [facility] [place]. Pt is out
